# Patient Record
Sex: Female | Born: 1960 | Race: Black or African American | Hispanic: No | Marital: Single | Smoking: Never smoker
Health system: Southern US, Community
[De-identification: ages and names within clinical notes are randomized; demographics above are authoritative.]

## PROBLEM LIST (undated history)

## (undated) DIAGNOSIS — D869 Sarcoidosis, unspecified: Secondary | ICD-10-CM

## (undated) DIAGNOSIS — I341 Nonrheumatic mitral (valve) prolapse: Secondary | ICD-10-CM

## (undated) DIAGNOSIS — I4891 Unspecified atrial fibrillation: Secondary | ICD-10-CM

## (undated) HISTORY — PX: CHOLECYSTECTOMY: SHX55

## (undated) HISTORY — PX: COLPOSCOPY VULVA W/ BIOPSY: SUR282

## (undated) HISTORY — PX: BREAST EXCISIONAL BIOPSY: SUR124

## (undated) HISTORY — DX: Unspecified atrial fibrillation: I48.91

---

## 2008-11-15 ENCOUNTER — Encounter: Admission: RE | Admit: 2008-11-15 | Discharge: 2008-11-15 | Payer: Self-pay | Admitting: Obstetrics and Gynecology

## 2008-11-22 ENCOUNTER — Encounter: Admission: RE | Admit: 2008-11-22 | Discharge: 2008-11-22 | Payer: Self-pay | Admitting: Obstetrics and Gynecology

## 2009-11-16 ENCOUNTER — Encounter: Admission: RE | Admit: 2009-11-16 | Discharge: 2009-11-16 | Payer: Self-pay | Admitting: Obstetrics and Gynecology

## 2010-10-30 ENCOUNTER — Other Ambulatory Visit: Payer: Self-pay | Admitting: Obstetrics and Gynecology

## 2010-10-30 DIAGNOSIS — Z1231 Encounter for screening mammogram for malignant neoplasm of breast: Secondary | ICD-10-CM

## 2010-11-20 ENCOUNTER — Ambulatory Visit
Admission: RE | Admit: 2010-11-20 | Discharge: 2010-11-20 | Disposition: A | Discharge status: Home / Self Care | Payer: BC Managed Care – PPO | Source: Ambulatory Visit | Attending: Obstetrics and Gynecology | Admitting: Obstetrics and Gynecology

## 2010-11-20 DIAGNOSIS — Z1231 Encounter for screening mammogram for malignant neoplasm of breast: Secondary | ICD-10-CM

## 2010-11-22 ENCOUNTER — Other Ambulatory Visit: Payer: Self-pay | Admitting: Obstetrics and Gynecology

## 2010-11-22 DIAGNOSIS — R928 Other abnormal and inconclusive findings on diagnostic imaging of breast: Secondary | ICD-10-CM

## 2010-12-11 ENCOUNTER — Ambulatory Visit
Admission: RE | Admit: 2010-12-11 | Discharge: 2010-12-11 | Disposition: A | Discharge status: Home / Self Care | Payer: BC Managed Care – PPO | Source: Ambulatory Visit | Attending: Obstetrics and Gynecology | Admitting: Obstetrics and Gynecology

## 2010-12-11 ENCOUNTER — Ambulatory Visit: Payer: BC Managed Care – PPO

## 2010-12-11 DIAGNOSIS — R928 Other abnormal and inconclusive findings on diagnostic imaging of breast: Secondary | ICD-10-CM

## 2011-06-08 ENCOUNTER — Emergency Department (HOSPITAL_COMMUNITY)
Admission: EM | Admit: 2011-06-08 | Discharge: 2011-06-09 | Disposition: A | Discharge status: Home / Self Care | Payer: BC Managed Care – PPO | Attending: Emergency Medicine | Admitting: Emergency Medicine

## 2011-06-08 ENCOUNTER — Encounter (HOSPITAL_COMMUNITY): Payer: Self-pay | Admitting: Family Medicine

## 2011-06-08 DIAGNOSIS — M542 Cervicalgia: Secondary | ICD-10-CM | POA: Insufficient documentation

## 2011-06-08 DIAGNOSIS — R079 Chest pain, unspecified: Secondary | ICD-10-CM | POA: Insufficient documentation

## 2011-06-08 DIAGNOSIS — M25519 Pain in unspecified shoulder: Secondary | ICD-10-CM | POA: Insufficient documentation

## 2011-06-08 DIAGNOSIS — T148XXA Other injury of unspecified body region, initial encounter: Secondary | ICD-10-CM | POA: Insufficient documentation

## 2011-06-08 HISTORY — DX: Sarcoidosis, unspecified: D86.9
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
## 2011-06-09 MED ORDER — CYCLOBENZAPRINE HCL 10 MG PO TABS: 10.0000 mg | ORAL_TABLET | Freq: Two times a day (BID) | ORAL | Status: AC | PRN

## 2011-06-09 MED ORDER — IBUPROFEN 800 MG PO TABS
800.0000 mg | ORAL_TABLET | Freq: Once | ORAL | Status: AC
  Administered 2011-06-09: 800 mg via ORAL
  Filled 2011-06-09: qty 1

## 2011-06-09 MED ORDER — CYCLOBENZAPRINE HCL 10 MG PO TABS
10.0000 mg | ORAL_TABLET | Freq: Once | ORAL | Status: AC
  Administered 2011-06-09: 10 mg via ORAL
  Filled 2011-06-09: qty 1

## 2011-06-09 MED ORDER — IBUPROFEN 800 MG PO TABS: 800.0000 mg | ORAL_TABLET | Freq: Three times a day (TID) | ORAL | Status: AC
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
## 2011-06-09 NOTE — ED Provider Notes (Signed)
Medical screening examination/treatment/procedure(s) were performed by non-physician practitioner and as supervising physician I was immediately available for consultation/collaboration.  Sunnie Nielsen, MD 06/09/11 [phone]

## 2011-11-05 ENCOUNTER — Other Ambulatory Visit: Payer: Self-pay | Admitting: Physician Assistant

## 2011-11-05 ENCOUNTER — Ambulatory Visit
Admission: RE | Admit: 2011-11-05 | Discharge: 2011-11-05 | Disposition: A | Discharge status: Home / Self Care | Payer: BC Managed Care – PPO | Source: Ambulatory Visit | Attending: Physician Assistant | Admitting: Physician Assistant

## 2011-11-05 DIAGNOSIS — R52 Pain, unspecified: Secondary | ICD-10-CM

## 2011-12-02 ENCOUNTER — Other Ambulatory Visit: Payer: Self-pay | Admitting: Obstetrics and Gynecology

## 2011-12-02 DIAGNOSIS — Z1231 Encounter for screening mammogram for malignant neoplasm of breast: Secondary | ICD-10-CM

## 2012-01-07 ENCOUNTER — Ambulatory Visit
Admission: RE | Admit: 2012-01-07 | Discharge: 2012-01-07 | Disposition: A | Discharge status: Home / Self Care | Payer: BC Managed Care – PPO | Source: Ambulatory Visit | Attending: Obstetrics and Gynecology | Admitting: Obstetrics and Gynecology

## 2012-01-07 DIAGNOSIS — Z1231 Encounter for screening mammogram for malignant neoplasm of breast: Secondary | ICD-10-CM

## 2012-11-10 ENCOUNTER — Other Ambulatory Visit: Payer: Self-pay

## 2012-11-10 DIAGNOSIS — Z1231 Encounter for screening mammogram for malignant neoplasm of breast: Secondary | ICD-10-CM

## 2013-01-07 ENCOUNTER — Ambulatory Visit
Admission: RE | Admit: 2013-01-07 | Discharge: 2013-01-07 | Disposition: A | Discharge status: Home / Self Care | Payer: BC Managed Care – PPO | Source: Ambulatory Visit

## 2013-01-07 DIAGNOSIS — Z1231 Encounter for screening mammogram for malignant neoplasm of breast: Secondary | ICD-10-CM

## 2013-12-21 ENCOUNTER — Other Ambulatory Visit: Payer: Self-pay

## 2013-12-21 DIAGNOSIS — Z1231 Encounter for screening mammogram for malignant neoplasm of breast: Secondary | ICD-10-CM

## 2014-01-10 ENCOUNTER — Ambulatory Visit
Admission: RE | Admit: 2014-01-10 | Discharge: 2014-01-10 | Disposition: A | Discharge status: Home / Self Care | Payer: BC Managed Care – PPO | Source: Ambulatory Visit

## 2014-01-10 DIAGNOSIS — Z1231 Encounter for screening mammogram for malignant neoplasm of breast: Secondary | ICD-10-CM

## 2014-12-04 ENCOUNTER — Emergency Department (HOSPITAL_COMMUNITY)
Admission: EM | Admit: 2014-12-04 | Discharge: 2014-12-04 | Disposition: A | Discharge status: Home / Self Care | Payer: BLUE CROSS/BLUE SHIELD | Attending: Emergency Medicine | Admitting: Emergency Medicine

## 2014-12-04 ENCOUNTER — Emergency Department (HOSPITAL_COMMUNITY): Payer: BLUE CROSS/BLUE SHIELD

## 2014-12-04 ENCOUNTER — Encounter (HOSPITAL_COMMUNITY): Payer: Self-pay

## 2014-12-04 DIAGNOSIS — R911 Solitary pulmonary nodule: Secondary | ICD-10-CM | POA: Diagnosis not present

## 2014-12-04 DIAGNOSIS — Z79899 Other long term (current) drug therapy: Secondary | ICD-10-CM | POA: Diagnosis not present

## 2014-12-04 DIAGNOSIS — R05 Cough: Secondary | ICD-10-CM

## 2014-12-04 DIAGNOSIS — D649 Anemia, unspecified: Secondary | ICD-10-CM | POA: Insufficient documentation

## 2014-12-04 DIAGNOSIS — D869 Sarcoidosis, unspecified: Secondary | ICD-10-CM | POA: Diagnosis not present

## 2014-12-04 DIAGNOSIS — R059 Cough, unspecified: Secondary | ICD-10-CM

## 2014-12-04 DIAGNOSIS — Z8679 Personal history of other diseases of the circulatory system: Secondary | ICD-10-CM | POA: Diagnosis not present

## 2014-12-04 HISTORY — DX: Nonrheumatic mitral (valve) prolapse: I34.1

## 2014-12-04 LAB — BASIC METABOLIC PANEL
Anion gap: 6 (ref 5–15)
BUN: 12 mg/dL (ref 6–20)
CO2: 28 mmol/L (ref 22–32)
Calcium: 9 mg/dL (ref 8.9–10.3)
Chloride: 107 mmol/L (ref 101–111)
Creatinine, Ser: 0.88 mg/dL (ref 0.44–1.00)
GFR calc Af Amer: >60 mL/min (ref >60)
GFR calc non Af Amer: >60 mL/min (ref >60)
Glucose, Bld: 95 mg/dL (ref 65–99)
Potassium: 3.6 mmol/L (ref 3.5–5.1)
Sodium: 141 mmol/L (ref 135–145)

## 2014-12-04 LAB — CBC
HCT: 34.1 % — ABNORMAL LOW (ref 36.0–46.0)
Hemoglobin: 11.3 g/dL — ABNORMAL LOW (ref 12.0–15.0)
MCH: 28.7 pg (ref 26.0–34.0)
MCHC: 33.1 g/dL (ref 30.0–36.0)
MCV: 86.5 fL (ref 78.0–100.0)
Platelets: 251 10*3/uL (ref 150–400)
RBC: 3.94 MIL/uL (ref 3.87–5.11)
RDW: 13.6 % (ref 11.5–15.5)
WBC: 6.1 10*3/uL (ref 4.0–10.5)

## 2014-12-04 LAB — I-STAT TROPONIN, ED: Troponin i, poc: 0 ng/mL (ref 0.00–0.08)

## 2014-12-04 MED ORDER — AZITHROMYCIN 250 MG PO TABS: 250.0000 mg | ORAL_TABLET | Freq: Every day | ORAL | Status: DC

## 2014-12-04 MED ORDER — PREDNISONE 10 MG PO TABS: 20.0000 mg | ORAL_TABLET | Freq: Every day | ORAL | Status: DC

## 2014-12-04 MED ORDER — PREDNISONE 20 MG PO TABS
60.0000 mg | ORAL_TABLET | Freq: Once | ORAL | Status: AC
Start: 2014-12-04 — End: 2014-12-04
  Filled 2014-12-04: qty 3

## 2014-12-04 MED ORDER — PREDNISONE 20 MG PO TABS
60.0000 mg | ORAL_TABLET | Freq: Once | ORAL | Status: AC
Start: 2014-12-04 — End: 2014-12-04
  Administered 2014-12-04: 60 mg via ORAL
  Filled 2014-12-04: qty 3

## 2014-12-04 MED ORDER — IOHEXOL 300 MG/ML  SOLN
75.0000 mL | Freq: Once | INTRAMUSCULAR | Status: AC | PRN
  Administered 2014-12-04: 75 mL via INTRAVENOUS

## 2014-12-04 NOTE — ED Provider Notes (Signed)
CSN: [id number]     Arrival date & time 12/04/14  1437 History   First MD Initiated Contact with Patient 12/04/14 1537     Chief Complaint  Patient presents with  . Cough  . Chest Pain     (Consider location/radiation/quality/duration/timing/severity/associated sxs/prior Treatment) HPI Patient with cough for one month she states that symptoms initially began with some URI type symptoms. She had objective fever and rhinorrhea. He thought that she continued to have cough. She does have a history of sarcoidosis but has not been treated for it for many years. She does not smoke. She has gotten more dyspneic today.  She has anterior chest pain which is sharp and worsens with cough.  She denies h.o. Of dvt or pe.     Past Medical History  Diagnosis Date  . Sarcoidosis (HCC)   . MVP (mitral valve prolapse)    Past Surgical History  Procedure Laterality Date  . Cesarean section    . Cholecystectomy    . Colposcopy vulva w/ biopsy     Family History  Problem Relation Age of Onset  . Stroke Mother   . Dementia Mother   . Heart failure Father   . Stroke Father   . Heart failure Sister    Social History  Substance Use Topics  . Smoking status: Never Smoker   . Smokeless tobacco: Never Used  . Alcohol Use: No   OB History    No data available     Review of Systems  All other systems reviewed and are negative.     Allergies  Review of patient's allergies indicates no known allergies.  Home Medications   Prior to Admission medications   Medication Sig Start Date End Date Taking? Authorizing Provider  BIOTIN PO Take 1 tablet by mouth daily.   Yes Historical Provider, MD  guaiFENesin (MUCINEX) 600 MG 12 hr tablet Take 1,200 mg by mouth 2 (two) times daily as needed for cough or to loosen phlegm.   Yes Historical Provider, MD  Multiple Vitamin (MULITIVITAMIN WITH MINERALS) TABS Take 1 tablet by mouth daily.   Yes Historical Provider, MD   BP 125/64 mmHg  Pulse 65   Temp(Src) 98.3 F (36.8 C) (Oral)  Resp 21  SpO2 100%  LMP 11/24/2014 Physical Exam  Constitutional: She is oriented to person, place, and time. She appears well-developed and well-nourished.  HENT:  Head: Normocephalic and atraumatic.  Right Ear: External ear normal.  Left Ear: External ear normal.  Nose: Nose normal.  Mouth/Throat: Oropharynx is clear and moist.  Eyes: Conjunctivae and EOM are normal. Pupils are equal, round, and reactive to light.  Neck: Normal range of motion. Neck supple. No JVD present. No tracheal deviation present. No thyromegaly present.  Cardiovascular: Normal rate, regular rhythm, normal heart sounds and intact distal pulses.   Pulmonary/Chest: Effort normal and breath sounds normal. She has no wheezes.  Abdominal: Soft. Bowel sounds are normal. She exhibits no mass. There is no tenderness. There is no guarding.  Musculoskeletal: Normal range of motion.  Lymphadenopathy:    She has no cervical adenopathy.  Neurological: She is alert and oriented to person, place, and time. She has normal reflexes. No cranial nerve deficit or sensory deficit. Gait normal. GCS eye subscore is 4. GCS verbal subscore is 5. GCS motor subscore is 6.  Reflex Scores:      Bicep reflexes are 2+ on the right side and 2+ on the left side.      Patellar  reflexes are 2+ on the right side and 2+ on the left side. Strength is normal and equal throughout. Cranial nerves grossly intact. Patient fluent. No gross ataxia and patient able to ambulate without difficulty.  Skin: Skin is warm and dry.  Psychiatric: She has a normal mood and affect. Her behavior is normal. Judgment and thought content normal.  Nursing note and vitals reviewed.   ED Course  Procedures (including critical care time) Labs Review Labs Reviewed  CBC - Abnormal; Notable for the following:    Hemoglobin 11.3 (*)    HCT 34.1 (*)    All other components within normal limits  BASIC METABOLIC PANEL  I-STAT  TROPOININ, ED    Imaging Review Dg Chest 2 View  12/04/2014  CLINICAL DATA:  Cough for more than 1 month, with shortness of breath and left chest pain. Current history of sarcoidosis. Initial encounter. EXAM: CHEST  2 VIEW COMPARISON:  None. FINDINGS: The lungs are well-aerated. There is a 2.4 cm nodule near the left lung base. Though this could reflect pulmonary sarcoidosis, malignancy cannot be excluded. There is no evidence of pleural effusion or pneumothorax. The cardiomediastinal silhouette is within normal limits. No acute osseous abnormalities are seen. IMPRESSION: 2.4 cm nodule noted near the left lung base. Though this could reflect pulmonary sarcoidosis given the patient's history, malignancy cannot be excluded. CT of the chest is recommended for further evaluation. Electronically Signed   By: Roanna Raider M.D.   On: 12/04/2014 16:09   Ct Chest W Contrast  12/04/2014  CLINICAL DATA:  Follow-up nodule in chest radiograph. History of sarcoidosis. EXAM: CT CHEST WITH CONTRAST TECHNIQUE: Multidetector CT imaging of the chest was performed during intravenous contrast administration. CONTRAST:  75mL OMNIPAQUE IOHEXOL 300 MG/ML  SOLN COMPARISON:  Chest radiograph December 04, 2014 at 1555 hours FINDINGS: MEDIASTINUM: Heart and pericardium are unremarkable. Thoracic aorta is normal course and caliber, unremarkable. No lymphadenopathy by CT size criteria. LUNGS: Tracheobronchial tree is patent, no pneumothorax. No pleural effusions, focal consolidations. Smoothly marginated 21 x 17 mm enhancing lingular nodule abutting the pericardium. SOFT TISSUES AND OSSEOUS STRUCTURES: Included view of the abdomen is not acute; status post cholecystectomy. Visualized soft tissues and included osseous structures appear normal. IMPRESSION: Solitary 21 x 17 mm lingular pulmonary nodule. Though this could represent sarcoidosis, recommend follow-up CT at around 3, 9 and 24 months, dynamic contrast-enhanced CT, PET and/or  biopsy. No acute cardiopulmonary process. Electronically Signed   By: Awilda Metro M.D.   On: 12/04/2014 18:34   I have personally reviewed and evaluated these images and lab results as part of my medical decision-making.   EKG Interpretation   Date/Time:  Sunday December 04 2014 14:55:16 EDT Ventricular Rate:  63 PR Interval:  189 QRS Duration: 76 QT Interval:  430 QTC Calculation: 440 R Axis:   70 Text Interpretation:  Sinus rhythm Borderline T abnormalities, anterior  leads Confirmed by Janna Oak MD, Duwayne Heck ([id number]) on 12/04/2014 3:49:08 PM      MDM   Final diagnoses:  Cough  Lung nodule  Sarcoid (HCC)  Anemia, unspecified anemia type   54 year old female history of sarcoidosis presents today with 1 month history of cough. Here plain x-Revis Whalin revealed a left lower lobe nodule and subsequently a CT was done. CT results reveal a 21 x 17 mm enhancing lingular nodule. I discussed this with the patient. It most likely represents sarcoidosis. However, she will be treated with antibiotics and will be placed on prednisone. She does have  a primary care doctor. I discussed with her the necessity of close follow-up before the prednisone burst is completed so a decision can be made regarding further treatment of sarcoid, assessment for infection, and need for follow-up CT scans. Patient has been hemodynamically stable here. She is mildly anemic with a hemoglobin of 11.3. She denies any bleeding. She will also have this followed up with her primary care physician.    Margarita Grizzleanielle Jackquelyn Sundberg, MD 12/04/14 Mikle [id number]

## 2014-12-04 NOTE — ED Notes (Signed)
Patient states she has had a cough >1 month and began having SOB andintermittent left chest pain since last night  that radiates into the left rib cage area. Patiaent has a history of MVP.

## 2014-12-04 NOTE — Discharge Instructions (Signed)
You have a lung nodule seen on your CT scan. You are being treated with antibiotics and steroids given your history of sarcoidosis. Please return if you're worse at any time. Please recheck with your doctor this week. Your hemoglobin is slightly low at 11.3 and should also be rechecked.

## 2014-12-04 NOTE — ED Notes (Signed)
She remains in no distress.  I have informed her and her husband about plan to CT and results thus far, for which they thank me.

## 2014-12-05 ENCOUNTER — Telehealth (HOSPITAL_COMMUNITY): Payer: Self-pay

## 2014-12-12 ENCOUNTER — Ambulatory Visit (INDEPENDENT_AMBULATORY_CARE_PROVIDER_SITE_OTHER): Payer: BLUE CROSS/BLUE SHIELD | Admitting: Internal Medicine

## 2014-12-12 ENCOUNTER — Encounter: Payer: Self-pay | Admitting: Internal Medicine

## 2014-12-12 ENCOUNTER — Other Ambulatory Visit: Payer: Self-pay

## 2014-12-12 VITALS — BP 110/72 | HR 56 | Ht 62.0 in | Wt 163.4 lb

## 2014-12-12 DIAGNOSIS — D860 Sarcoidosis of lung: Secondary | ICD-10-CM

## 2014-12-12 DIAGNOSIS — Z1231 Encounter for screening mammogram for malignant neoplasm of breast: Secondary | ICD-10-CM

## 2014-12-12 DIAGNOSIS — R05 Cough: Secondary | ICD-10-CM

## 2014-12-12 DIAGNOSIS — R058 Other specified cough: Secondary | ICD-10-CM

## 2014-12-12 MED ORDER — FAMOTIDINE 20 MG PO TABS: ORAL_TABLET | ORAL | Status: DC

## 2014-12-12 MED ORDER — PANTOPRAZOLE SODIUM 40 MG PO TBEC: 40.0000 mg | DELAYED_RELEASE_TABLET | Freq: Every day | ORAL | Status: DC

## 2014-12-12 MED ORDER — PREDNISONE 10 MG PO TABS: ORAL_TABLET | ORAL | Status: DC

## 2014-12-12 NOTE — Progress Notes (Signed)
Subjective:    Patient ID: Christina Mclaughlin, female    DOB: 1960/10/08, MRN: [id number]  HPI  [id number] yobf never smoker or passive smoke with h/o sarcoid 1999 [facility] with joint pain, ocular symptoms and "abn cxr"  rx with pred x < 1 years  Referred 12/12/2014  by Dr Nicholos Johnseade for cough ? Sarcoid   12/12/2014 1st [facility] office visit/ Ares Cardozo   Chief Complaint  Patient presents with  . Pulmonary Consult    Referred by Dr. Elias Elseobert Reade. Pt states dxed with Sarcoid in 1998 by lung bx. She c/o increased cough x 4 months- non prod and sometimes wakes her up at night.   cough was insidious p uri early summer 2016 , persistent daily and at least sev times noct also > L chest discomfort  p severe spell of coughing > [facility] ER 12/04/14 ? Sarcoid > pred 60 > 20 mg per day taken at lunch time and no improvement in cough by Dec 08 2014 and started tessilon which helped a lot still taking up ot 200 mg bid. No ocular or joint symptoms now and did not have cough or sob ever at onset of sarcoid in 1999.  No baseline cxr's or records avail at ov   No obvious day to day or daytime variability in terms of the cough  or assoc sob or cp or chest tightness, subjective wheeze or overt sinus or hb symptoms. No unusual exp hx or h/o childhood pna/ asthma or knowledge of premature birth.  Sleeping ok without nocturnal  or early am exacerbation  of respiratory  c/o's or need for noct saba. Also denies any obvious fluctuation of symptoms with weather or environmental changes or other aggravating or alleviating factors except as outlined above   Current Medications, Allergies, Complete Past Medical History, Past Surgical History, Family History, and Social History were reviewed in [facility] electronic medical record.             Review of Systems  Constitutional: Negative for fever, chills and unexpected weight change.  HENT: Negative for congestion, dental problem, ear pain, nosebleeds, postnasal drip,  rhinorrhea, sinus pressure, sneezing, sore throat, trouble swallowing and voice change.   Eyes: Negative for visual disturbance.  Respiratory: Positive for cough. Negative for choking and shortness of breath.   Cardiovascular: Negative for chest pain and leg swelling.  Gastrointestinal: Negative for vomiting, abdominal pain and diarrhea.  Genitourinary: Negative for difficulty urinating.  Musculoskeletal: Negative for arthralgias.  Skin: Negative for rash.  Neurological: Negative for tremors, syncope and headaches.  Hematological: Does not bruise/bleed easily.       Objective:   Physical Exam  amb healthy appearing mildly obese  Black female nad  Wt Readings from Last 3 Encounters:  12/12/14 163 lb 6.4 oz (74.118 kg)    Vital signs reviewed   HEENT: nl dentition, turbinates, and oropharynx. Nl external ear canals without cough reflex   NECK :  without JVD/Nodes/TM/ nl carotid upstrokes bilaterally   LUNGS: no acc muscle use, clear to A and P bilaterally without cough on insp or exp maneuvers   CV:  RRR  no s3 or murmur or increase in P2, no edema   ABD:  soft and nontender with nl excursion in the supine position. No bruits or organomegaly, bowel sounds nl  MS:  warm without deformities, calf tenderness, cyanosis or clubbing  SKIN: warm and dry without lesions    NEURO:  alert, approp, no deficits  I personally reviewed images and agree with radiology impression as follows:  CXR:  12/04/14 with no baseline avail  2.4 cm nodule noted near the left lung base/ no sign adenopathy confirmed on CT 12/04/14     Assessment & Plan:

## 2014-12-12 NOTE — Assessment & Plan Note (Addendum)
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    Discussed in detail all the  indications, usual  risks and alternatives  relative to the benefits with patient who agrees to proceed with conservative f/u as outlined     In addition, her cough is no better on high doses of pred so rec she taper off as this is not likely at all related to sarcoid

## 2014-12-12 NOTE — Patient Instructions (Addendum)
Pantoprazole (protonix) 40 mg   Take  30-60 min before first meal of the day and Pepcid (famotidine)  20 mg one @  bedtime until return to office - this is the best way to tell whether stomach acid is contributing to your problem.   GERD (REFLUX)  is an extremely common cause of respiratory symptoms just like yours , many times with no obvious heartburn at all.    It can be treated with medication, but also with lifestyle changes including elevation of the head of your bed (ideally with 6 inch  bed blocks),  Smoking cessation, avoidance of late meals, excessive alcohol, and avoid fatty foods, chocolate, peppermint, colas, red wine, and acidic juices such as orange juice.  NO MINT OR MENTHOL PRODUCTS SO NO COUGH DROPS  USE SUGARLESS CANDY INSTEAD (Jolley ranchers or Stover's or Life Savers) or even ice chips will also do - the key is to swallow to prevent all throat clearing. NO OIL BASED VITAMINS - use powdered substitutes.  Try to obtain [facility] pulmonary [facility] and your last cxr report from [place] and bring them with you to next office visit   Prednisone 10 mg per day x one week then 5 mg daily x 2 weeks then return in 4 weeks with cxr

## 2014-12-13 DIAGNOSIS — R05 Cough: Secondary | ICD-10-CM | POA: Insufficient documentation

## 2014-12-13 DIAGNOSIS — R058 Other specified cough: Secondary | ICD-10-CM | POA: Insufficient documentation

## 2014-12-13 NOTE — Assessment & Plan Note (Signed)
The most common causes of chronic cough in immunocompetent adults include the following: upper airway cough syndrome (UACS), previously referred to as postnasal drip syndrome (PNDS), which is caused by variety of rhinosinus conditions; (2) asthma; (3) GERD; (4) chronic bronchitis from cigarette smoking or other inhaled environmental irritants; (5) nonasthmatic eosinophilic bronchitis; and (6) bronchiectasis.   These conditions, singly or in combination, have accounted for up to 94% of the causes of chronic cough in prospective studies.   Other conditions have constituted no >6% of the causes in prospective studies These have included bronchogenic carcinoma, chronic interstitial pneumonia, sarcoidosis, left ventricular failure, ACEI-induced cough, and aspiration from a condition associated with pharyngeal dysfunction.    Chronic cough is often simultaneously caused by more than one condition. A single cause has been found from 38 to 82% of the time, multiple causes from 18 to 62%. Multiply caused cough has been the result of three diseases up to 42% of the time.       Based on hx and exam, this is most likely:  Classic Upper airway cough syndrome, so named because it's frequently impossible to sort out how much is  CR/sinusitis with freq throat clearing (which can be related to primary GERD)   vs  causing  secondary (" extra esophageal")  GERD from wide swings in gastric pressure that occur with throat clearing, often  promoting self use of mint and menthol lozenges that reduce the lower esophageal sphincter tone and exacerbate the problem further in a cyclical fashion.   These are the same pts (now being labeled as having "irritable larynx syndrome" by some cough centers) who not infrequently have a history of having failed to tolerate ace inhibitors,  dry powder inhalers or biphosphonates or report having atypical reflux symptoms that don't respond to standard doses of PPI , and are easily confused as  having aecopd or asthma flares by even experienced allergists/ pulmonologists.   The first step is to maximize acid suppression and eliminate cyclical coughing then regroup if the cough persists.  I had an extended discussion with the patient reviewing all relevant studies completed to date and  lasting 35 min  1) Explained: The standardized cough guidelines published in Chest by Richard Irwin in 2006 are still the best available and consist of a multiple step process (up to 12!) , not a single office visit,  and are intended  to address this problem logically,  with an alogrithm dependent on response to empiric treatment at  each progressive step  to determine a specific diagnosis with  minimal addtional testing needed. Therefore if adherence is an issue or can't be accurately verified,  it's very unlikely the standard evaluation and treatment will be successful here.    Furthermore, response to therapy (other than acute cough suppression, which should only be used short term with avoidance of narcotic containing cough syrups if possible), can be a gradual process for which the patient may perceive immediate benefit.  Unlike going to an eye doctor where the best perscription is almost always the first one and is immediately effective, this is almost never the case in the management of chronic cough syndromes. Therefore the patient needs to commit up front to consistently adhere to recommendations  for up to 6 weeks of therapy directed at the likely underlying problem(s) before the response can be reasonably evaluated.     2) Each maintenance medication was reviewed in detail including most importantly the difference between maintenance and prns and under what   circumstances the prns are to be triggered using an action plan format that is not reflected in the computer generated alphabetically organized AVS.    Please see instructions for details which were reviewed in writing and the patient given a  copy highlighting the part that I personally wrote and discussed at today's ov.   See instructions for specific recommendations which were reviewed directly with the patient who was given a copy with highlighter outlining the key components.   

## 2015-01-09 ENCOUNTER — Encounter: Payer: Self-pay | Admitting: Internal Medicine

## 2015-01-09 ENCOUNTER — Ambulatory Visit (INDEPENDENT_AMBULATORY_CARE_PROVIDER_SITE_OTHER): Payer: BLUE CROSS/BLUE SHIELD | Admitting: Internal Medicine

## 2015-01-09 ENCOUNTER — Ambulatory Visit (INDEPENDENT_AMBULATORY_CARE_PROVIDER_SITE_OTHER)
Admission: RE | Admit: 2015-01-09 | Discharge: 2015-01-09 | Disposition: A | Discharge status: Home / Self Care | Payer: BLUE CROSS/BLUE SHIELD | Source: Ambulatory Visit | Attending: Internal Medicine | Admitting: Internal Medicine

## 2015-01-09 ENCOUNTER — Other Ambulatory Visit (INDEPENDENT_AMBULATORY_CARE_PROVIDER_SITE_OTHER): Payer: BLUE CROSS/BLUE SHIELD

## 2015-01-09 VITALS — BP 122/78 | HR 57 | Ht 62.0 in | Wt 167.0 lb

## 2015-01-09 DIAGNOSIS — D860 Sarcoidosis of lung: Secondary | ICD-10-CM

## 2015-01-09 DIAGNOSIS — R05 Cough: Secondary | ICD-10-CM

## 2015-01-09 DIAGNOSIS — R058 Other specified cough: Secondary | ICD-10-CM

## 2015-01-09 LAB — CBC WITH DIFFERENTIAL/PLATELET
Basophils Absolute: 0 10*3/uL (ref 0.0–0.1)
Basophils Relative: 0.6 % (ref 0.0–3.0)
Eosinophils Absolute: 0.2 10*3/uL (ref 0.0–0.7)
Eosinophils Relative: 4.3 % (ref 0.0–5.0)
HCT: 35.2 % — ABNORMAL LOW (ref 36.0–46.0)
Hemoglobin: 11.7 g/dL — ABNORMAL LOW (ref 12.0–15.0)
Lymphocytes Relative: 41.1 % (ref 12.0–46.0)
Lymphs Abs: 1.5 10*3/uL (ref 0.7–4.0)
MCHC: 33.2 g/dL (ref 30.0–36.0)
MCV: 85.4 fl (ref 78.0–100.0)
Monocytes Absolute: 0.4 10*3/uL (ref 0.1–1.0)
Monocytes Relative: 10.7 % (ref 3.0–12.0)
Neutro Abs: 1.6 10*3/uL (ref 1.4–7.7)
Neutrophils Relative %: 43.3 % (ref 43.0–77.0)
Platelets: 241 10*3/uL (ref 150.0–400.0)
RBC: 4.12 Mil/uL (ref 3.87–5.11)
RDW: 14.3 % (ref 11.5–15.5)
WBC: 3.7 10*3/uL — ABNORMAL LOW (ref 4.0–10.5)

## 2015-01-09 LAB — SEDIMENTATION RATE: Sed Rate: 36 mm/hr — ABNORMAL HIGH (ref 0–22)

## 2015-01-09 NOTE — Progress Notes (Signed)
Quick Note:  Patient returned call. Advised of lab results / recs as stated by MW. Pt verbalized understanding and denied any questions. ______ 

## 2015-01-09 NOTE — Progress Notes (Signed)
Subjective:    Patient ID: Christina Mclaughlin, female    DOB: Jul 06, 1960    MRN: [id number]   Brief patient profile:  86 yobf never smoker or passive smoke with h/o sarcoid 1999 dx at [place] with joint pain, ocular symptoms and "abn cxr"  rx with pred x < 1 years  Referred 12/12/2014  by Dr Alyson Ingles for cough ? Sarcoid    History of Present Illness  12/12/2014 1st [facility] office visit/ Christina Mclaughlin   Chief Complaint  Patient presents with  . Pulmonary Consult    Referred by Dr. Maury Dus. Pt states dxed with Sarcoid in 1998 by lung bx. She c/o increased cough x 4 months- non prod and sometimes wakes her up at night.   cough was insidious p uri early summer 2016 , persistent daily and at least sev times noct also > L chest discomfort  p severe spell of coughing > [facility] ER 12/04/14 ? Sarcoid > pred 60 > 20 mg per day taken at lunch time and no improvement in cough by Dec 08 2014 and started tessilon which helped a lot still taking up ot 200 mg bid. No ocular or joint symptoms now and did not have cough or sob ever at onset of sarcoid in 1999.  No baseline cxr's or records avail at ov  rec Pantoprazole (protonix) 40 mg   Take  30-60 min before first meal of the day and Pepcid (famotidine)  20 mg one @  bedtime until return to office - this is the best way to tell whether stomach acid is contributing to your problem.  GERD diet  Try to obtain your last pulmonary note from [facility] and your last cxr report from [place] and bring them with you to next office visit  Prednisone 10 mg per day x one week then 5 mg daily x 2 weeks then return in 4 weeks with cxr     01/09/2015  f/u ov/Birney Belshe re: cough since July 2016 better off pred and on acid suppression  Chief Complaint  Patient presents with  . Follow-up    Pt c/o mild cough and some nasal congestion. Pt denies wheeze/SOB/CP/tightness. Pt states that she can tell her symptoms flare when off of steroids.   one week prior to OV  p commercial jet onset  nasal congestion/ min cough no nasty mucus/ no sob    No obvious day to day or daytime variability or assoc chronic cough or cp or chest tightness, subjective wheeze or overt sinus or hb symptoms. No unusual exp hx or h/o childhood pna/ asthma or knowledge of premature birth.  Sleeping ok without nocturnal  or early am exacerbation  of respiratory  c/o's or need for noct saba. Also denies any obvious fluctuation of symptoms with weather or environmental changes or other aggravating or alleviating factors except as outlined above   Current Medications, Allergies, Complete Past Medical History, Past Surgical History, Family History, and Social History were reviewed in [employer] record.  ROS  The following are not active complaints unless bolded sore throat, dysphagia, dental problems, itching, sneezing,  nasal congestion or excess/ purulent secretions, ear ache,   fever, chills, sweats, unintended wt loss, classically pleuritic or exertional cp, hemoptysis,  orthopnea pnd or leg swelling, presyncope, palpitations, abdominal pain, anorexia, nausea, vomiting, diarrhea  or change in bowel or bladder habits, change in stools or urine, dysuria,hematuria,  rash, arthralgias, visual complaints, headache, numbness, weakness or ataxia or problems with walking  or coordination,  change in mood/affect or memory.                       Objective:   Physical Exam  amb healthy appearing mildly obese hoarse  Black female nad  Wt Readings from Last 3 Encounters:  01/09/15 167 lb (75.751 kg)  12/12/14 163 lb 6.4 oz (74.118 kg)    Vital signs reviewed   HEENT: nl dentition, turbinates, and oropharynx. Nl external ear canals without cough reflex   NECK :  without JVD/Nodes/TM/ nl carotid upstrokes bilaterally   LUNGS: no acc muscle use, clear to A and P bilaterally without cough on insp or exp maneuvers   CV:  RRR  no s3 or murmur or increase in P2, no edema   ABD:  soft  and nontender with nl excursion in the supine position. No bruits or organomegaly, bowel sounds nl  MS:  warm without deformities, calf tenderness, cyanosis or clubbing  SKIN: warm and dry without lesions    NEURO:  alert, approp, no deficits     Labs ordered esr/ ACE/ diff    CXR PA and Lateral:   01/09/2015 :    I personally reviewed images and agree with radiology impression as follows:    2.3 cm lingular nodule unchanged. No other nodule or adenopathy.      Assessment & Plan:

## 2015-01-09 NOTE — Assessment & Plan Note (Signed)
Christina Mclaughlin Rx rec 12/12/2014 > improved until commercial flight end of Nov 2016   Now more c/w uri vs sinus dz  Explained the natural history of uri and why it's necessary in patients at risk to treat GERD aggressively - at least  short term -   to reduce risk of evolving cyclical cough initially  triggered by epithelial injury and a heightened sensitivty to the effects of any upper airway irritants,  most importantly acid - related - then perpetuated by epithelial injury related to the cough itself as the upper airway collapses on itself.  That is, the more sensitive the epithelium becomes once it is damaged by the virus, the more the ensuing irritability> the more the cough, the more the secondary reflux (especially in those prone to reflux) the more the irritation of the sensitive mucosa and so on in a  Classic cyclical pattern.   rec advil cold and sinus, max gerd rx for now / f/u in 6 weeks with sinus study in meantime prn

## 2015-01-09 NOTE — Assessment & Plan Note (Signed)
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 and would like to look at previous pulmonary note/ last cxr before embarking on an invasive w/u  I had an extended discussion with the patient reviewing all relevant studies completed to date and  lasting 15 to 20 minutes of a 25 minute visit   Discussed in detail all the  indications, usual  risks and alternatives  relative to the benefits with patient who agrees to proceed with conservative f/u as outlined     Each maintenance medication was reviewed in detail including most importantly the difference between maintenance and prns and under what circumstances the prns are to be triggered using an action plan format that is not reflected in the computer generated alphabetically organized AVS.    Please see instructions for details which were reviewed in writing and the patient given a copy highlighting the part that I personally wrote and discussed at today's ov.

## 2015-01-09 NOTE — Patient Instructions (Addendum)
Please remember to go to the lab and  x-ray department downstairs for your tests - we will call you with the results when they are available.  We really need your last cxr report and note from your pulmonary doctor   Stay on acid suppression and diet until no need for cough medication for a week   For cold symptoms ok to use advil cold and sinus as needed  Please schedule a follow up office visit in 6 weeks, call sooner if needed

## 2015-01-10 LAB — ANGIOTENSIN CONVERTING ENZYME: Angiotensin-Converting Enzyme: 60 U/L — ABNORMAL HIGH (ref 8–52)

## 2015-01-13 ENCOUNTER — Ambulatory Visit: Payer: BLUE CROSS/BLUE SHIELD

## 2015-01-23 ENCOUNTER — Telehealth: Payer: Self-pay | Admitting: Internal Medicine

## 2015-01-23 NOTE — Telephone Encounter (Signed)
Left message for patient to call back  

## 2015-01-23 NOTE — Telephone Encounter (Signed)
I have not seen them and neither has MW, sorry

## 2015-01-23 NOTE — Telephone Encounter (Signed)
Pt returning call and can be reached @ [phone].Caren GriffinsStanley A Dalton

## 2015-01-23 NOTE — Telephone Encounter (Signed)
Do not see records scanned into patient's chart.  Verlon AuLeslie - do you know if these records have been received?  Attempted to contact patient, Left message for patient to call back.

## 2015-01-25 NOTE — Telephone Encounter (Signed)
LMTCB

## 2015-01-26 NOTE — Telephone Encounter (Signed)
lmtcb X3 for pt.  Will close message per triage protocol.  

## 2015-02-02 ENCOUNTER — Ambulatory Visit
Admission: RE | Admit: 2015-02-02 | Discharge: 2015-02-02 | Disposition: A | Discharge status: Home / Self Care | Payer: BLUE CROSS/BLUE SHIELD | Source: Ambulatory Visit

## 2015-02-02 DIAGNOSIS — Z1231 Encounter for screening mammogram for malignant neoplasm of breast: Secondary | ICD-10-CM

## 2015-02-20 ENCOUNTER — Ambulatory Visit: Payer: BLUE CROSS/BLUE SHIELD | Admitting: Internal Medicine

## 2016-01-11 ENCOUNTER — Other Ambulatory Visit: Payer: Self-pay | Admitting: Family Medicine

## 2016-01-11 DIAGNOSIS — Z1231 Encounter for screening mammogram for malignant neoplasm of breast: Secondary | ICD-10-CM

## 2016-02-14 ENCOUNTER — Ambulatory Visit
Admission: RE | Admit: 2016-02-14 | Discharge: 2016-02-14 | Disposition: A | Discharge status: Home / Self Care | Payer: BLUE CROSS/BLUE SHIELD | Source: Ambulatory Visit | Attending: Family Medicine | Admitting: Family Medicine

## 2016-02-14 DIAGNOSIS — Z1231 Encounter for screening mammogram for malignant neoplasm of breast: Secondary | ICD-10-CM

## 2017-01-24 IMAGING — CR DG CHEST 2V
2 series · 2 of 2 positions shown · non-contrast
Comparison: None.

CLINICAL DATA: Cough for more than 1 month, with shortness of
breath and left chest pain. Current history of sarcoidosis. Initial
encounter.

EXAM:
CHEST  2 VIEW

[w chest pa]
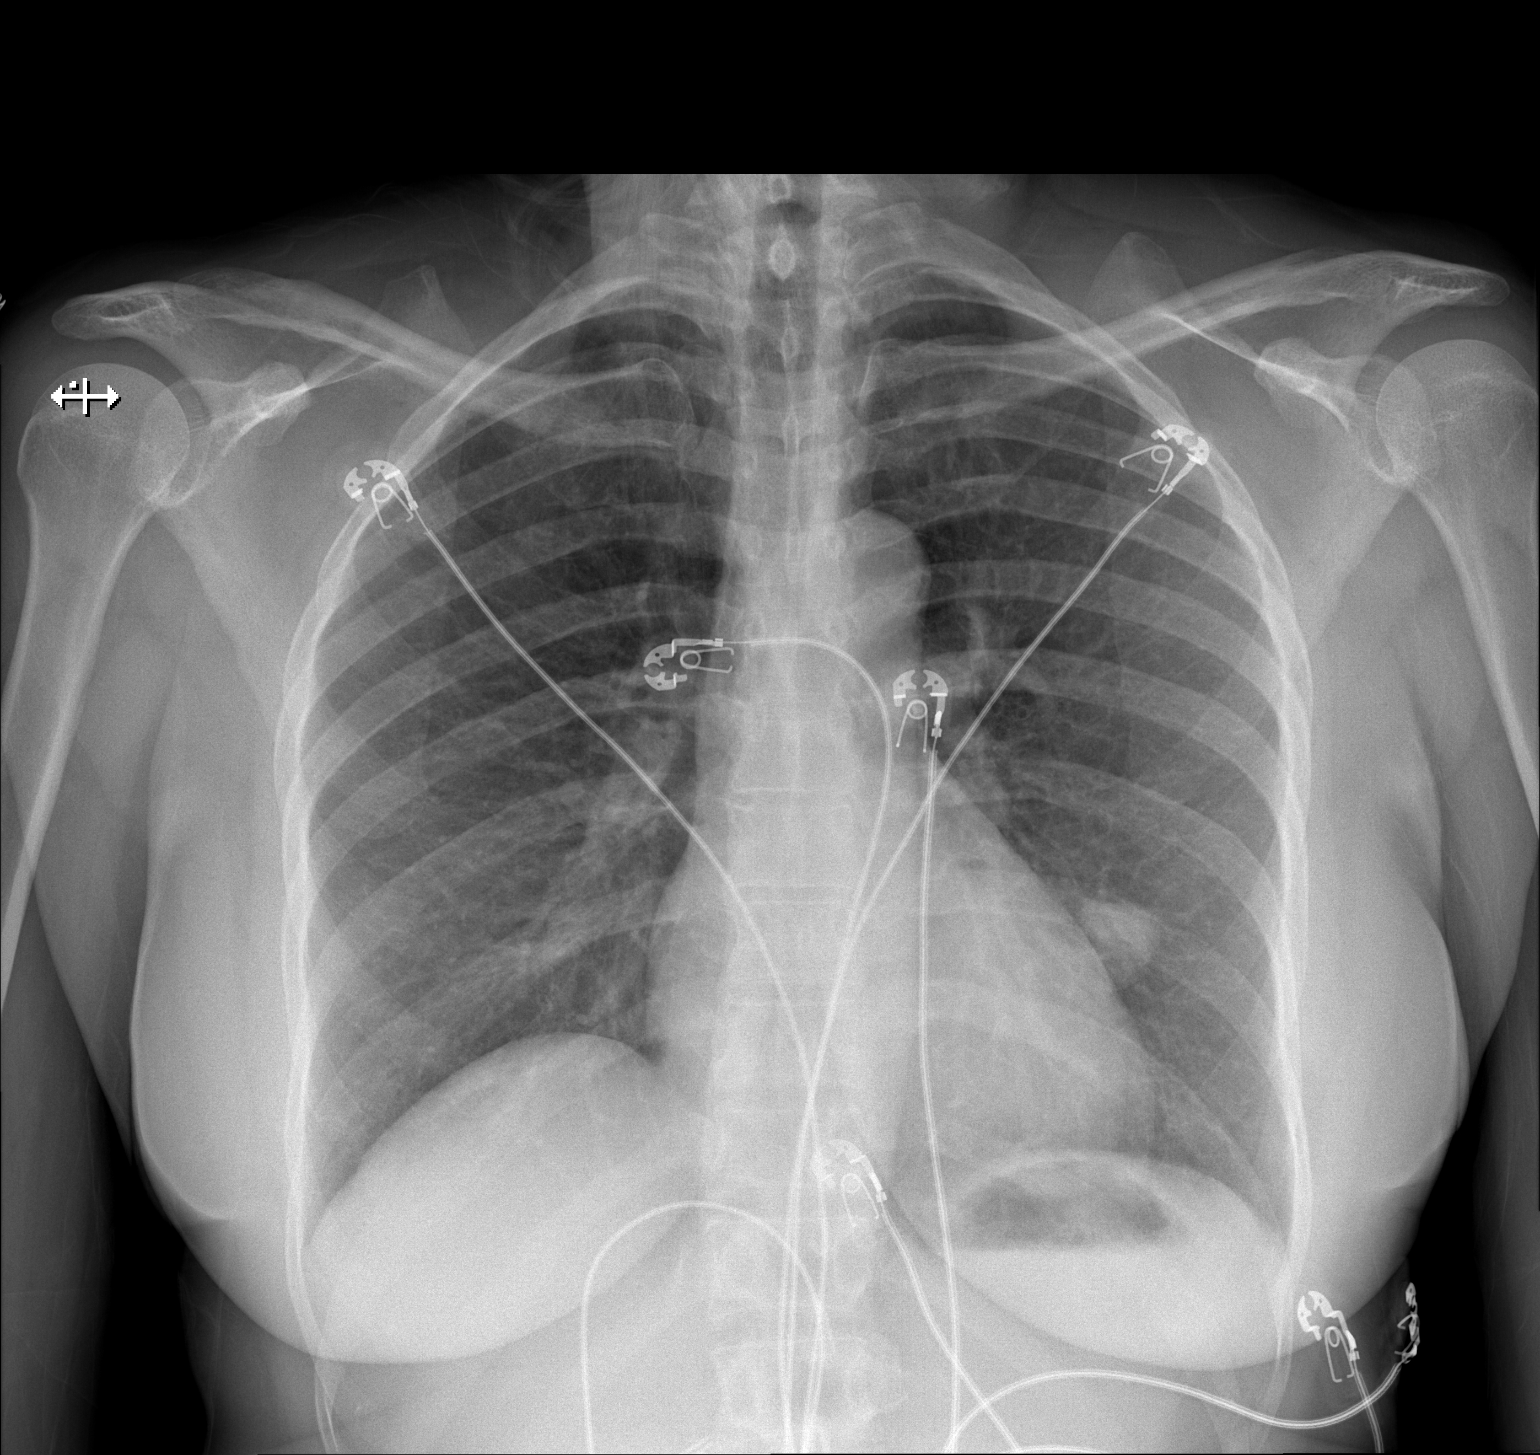

[w chest lat]
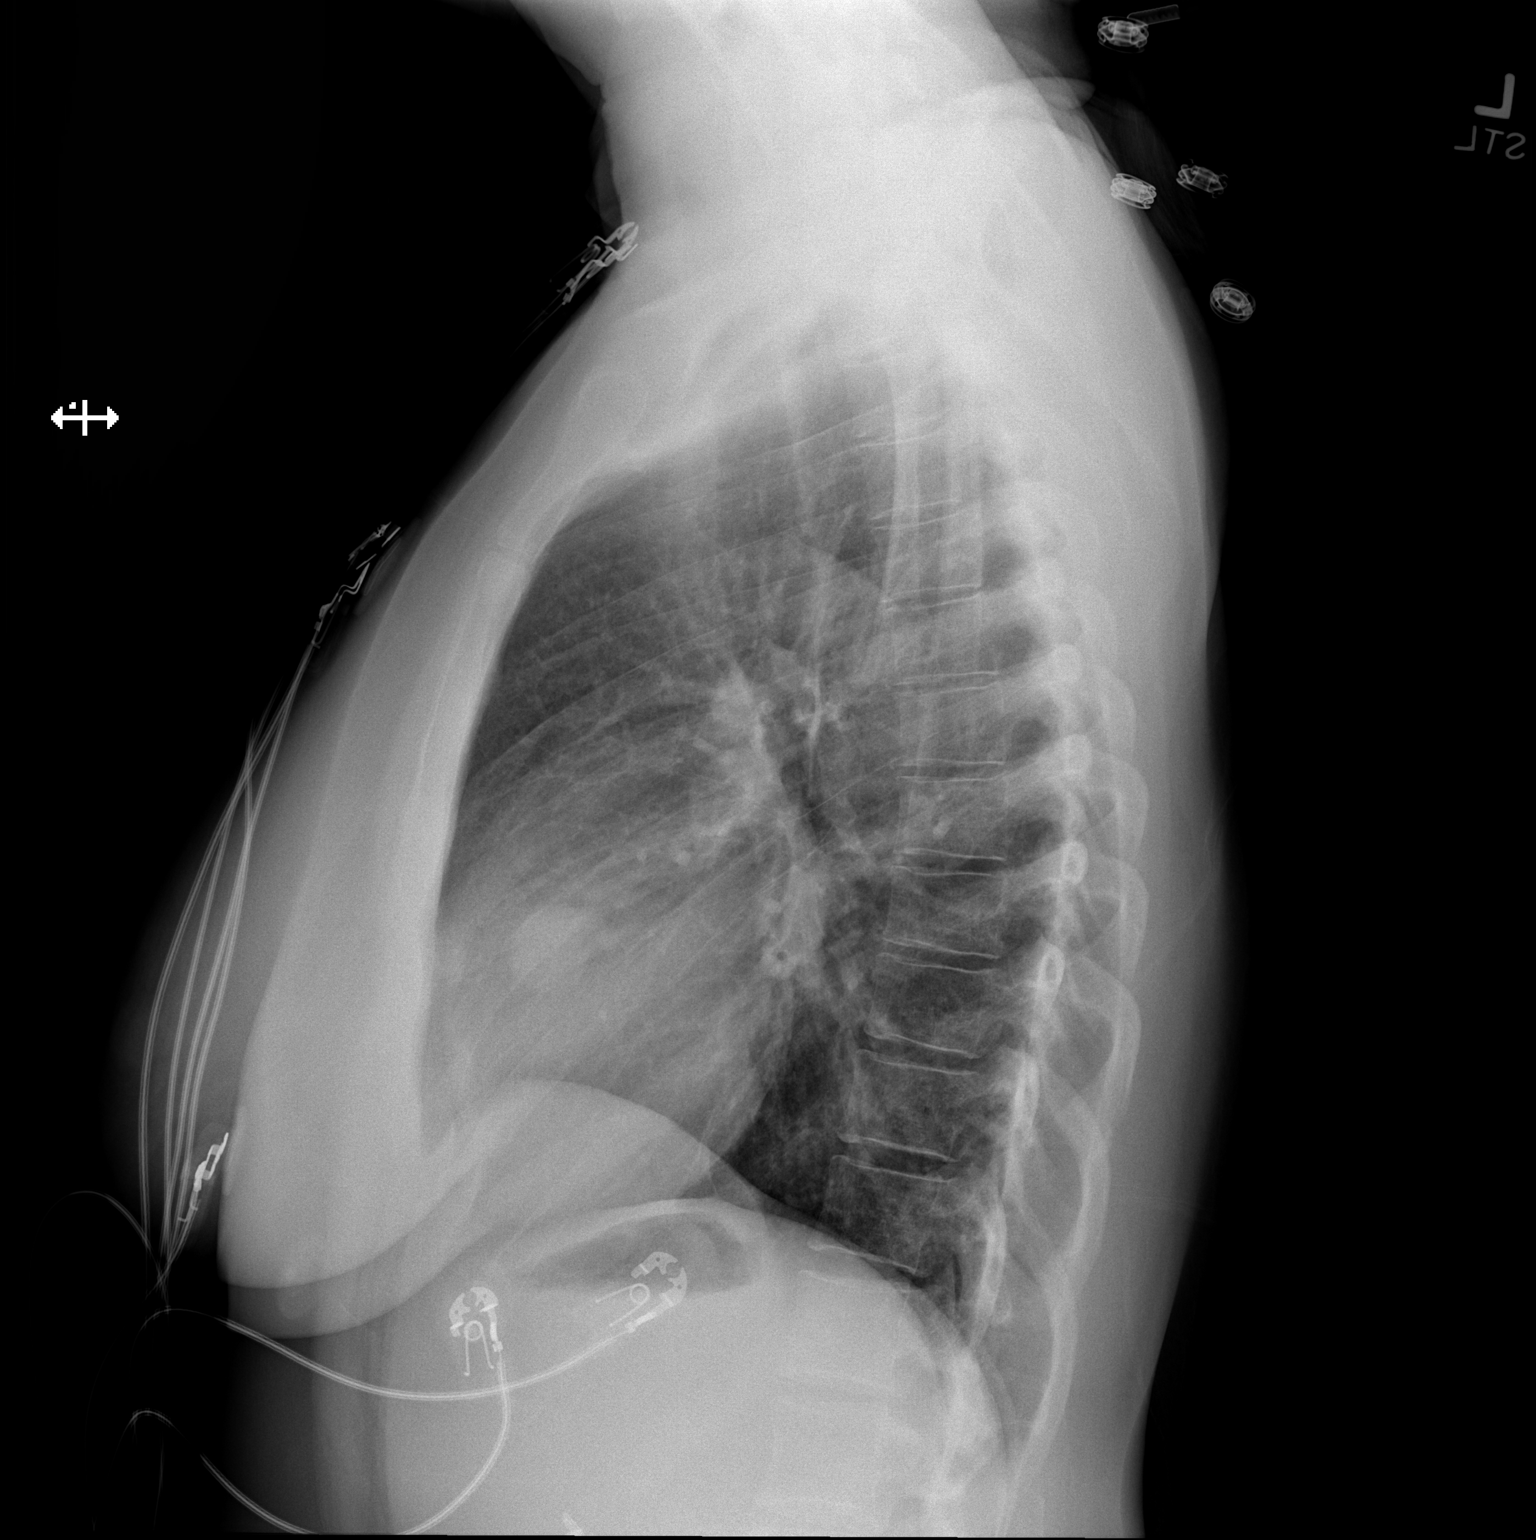

[2 of 2 positions shown; findings below may reference images not displayed]

FINDINGS: The lungs are well-aerated. There is a 2.4 cm nodule near the left
lung base. Though this could reflect pulmonary sarcoidosis,
malignancy cannot be excluded. There is no evidence of pleural
effusion or pneumothorax.

The cardiomediastinal silhouette is within normal limits. No acute
osseous abnormalities are seen.
IMPRESSION: 2.4 cm nodule noted near the left lung base. Though this could
reflect pulmonary sarcoidosis given the patient's history,
malignancy cannot be excluded. CT of the chest is recommended for
further evaluation.

## 2017-01-24 IMAGING — CT CT CHEST W/ CM
2 of 4 series · 15 of 36 positions shown, 18 images · IV contrast (omnipaque)
Comparison: Chest radiograph December 04, 2014 at 2666 hours

CLINICAL DATA: Follow-up nodule in chest radiograph. History of
sarcoidosis.

EXAM:
CT CHEST WITH CONTRAST
TECHNIQUE: Multidetector CT imaging of the chest was performed during
intravenous contrast administration.
CONTRAST:  75mL OMNIPAQUE IOHEXOL 300 MG/ML  SOLN

[Series 2: chest with st · axial · 0.55mm/px · z∈[-262,-22]mm · 12 of 56 slices shown, 15 images]
[im 4/56  mediastinal]
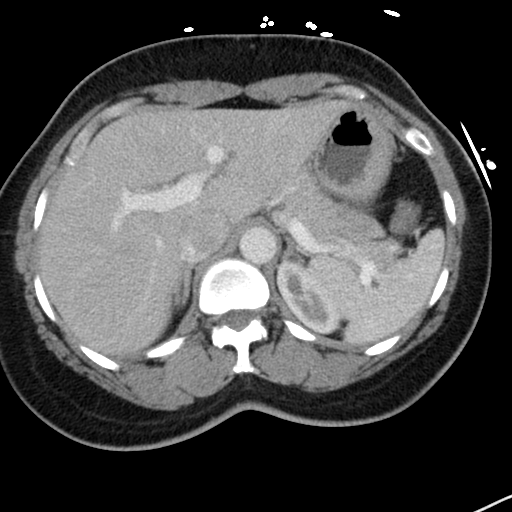
[im 4/56  lung]
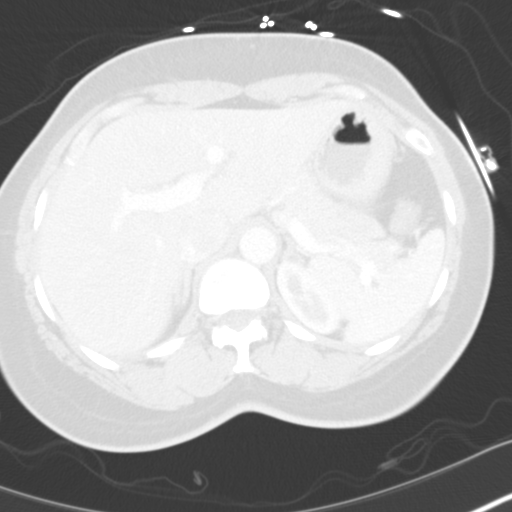
[im 8/56  lung]
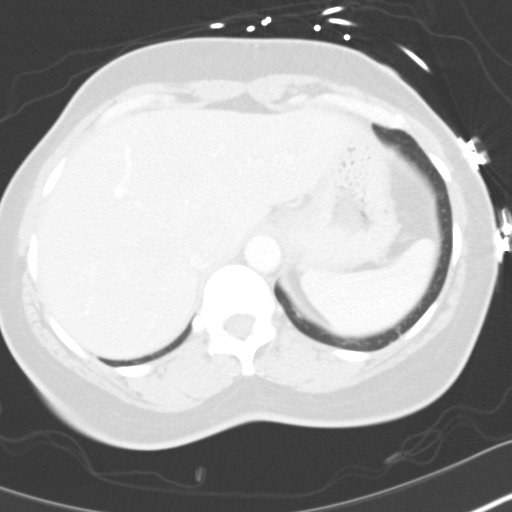
[im 12/56  lung]
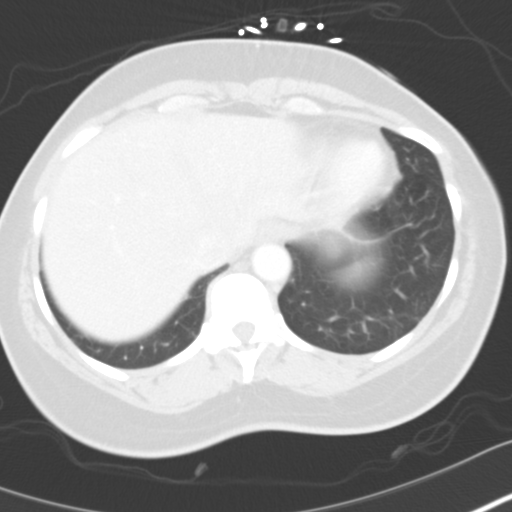
[im 16/56  lung]
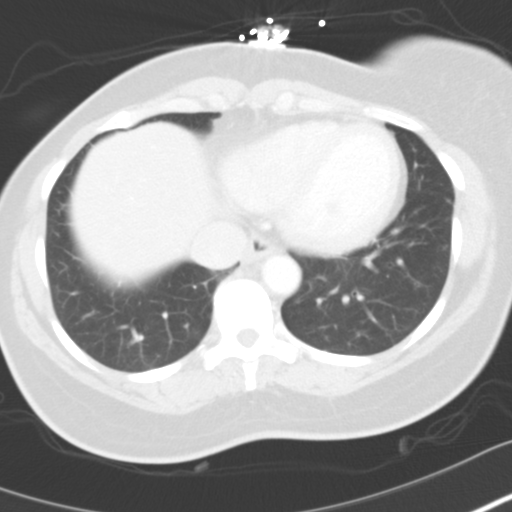
[im 20/56  mediastinal]
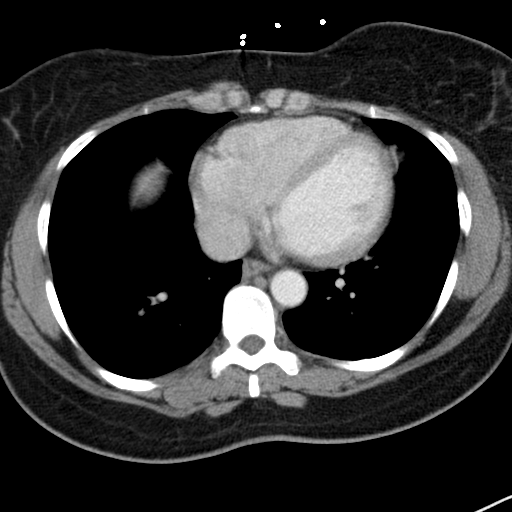
[im 20/56  lung]
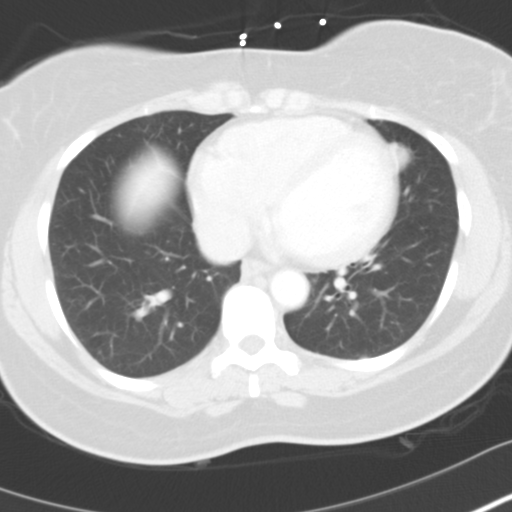
[im 24/56  lung]
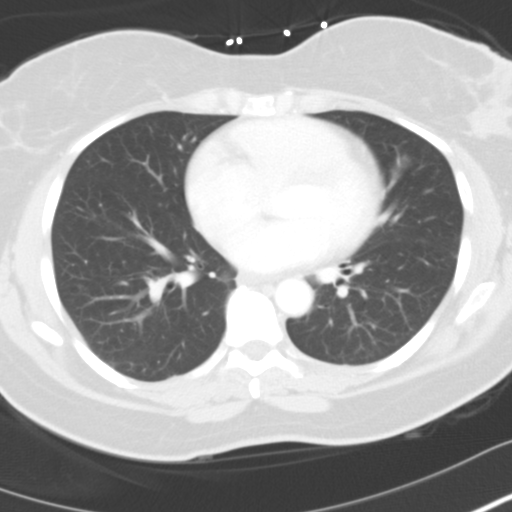
[im 32/56  lung]
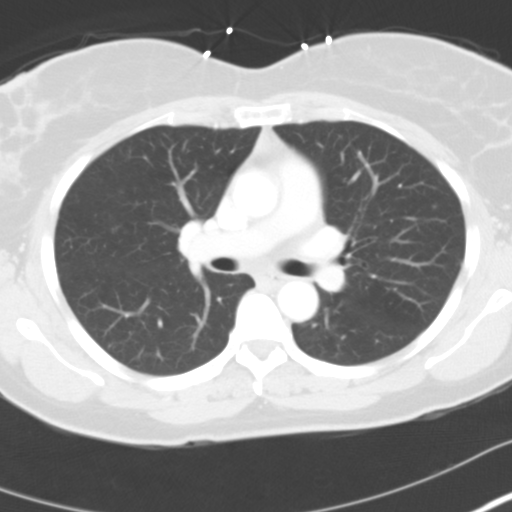
[im 36/56  lung]
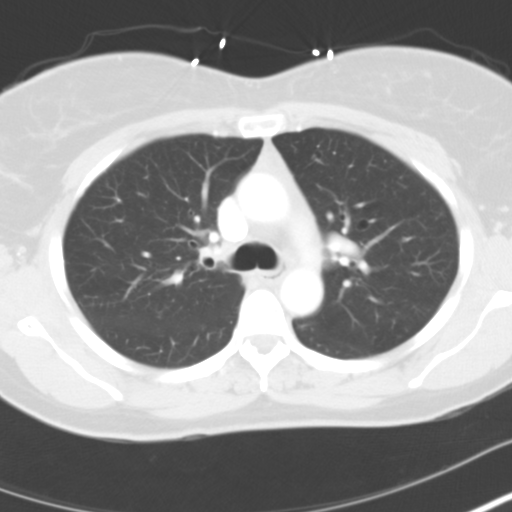
[im 40/56  mediastinal]
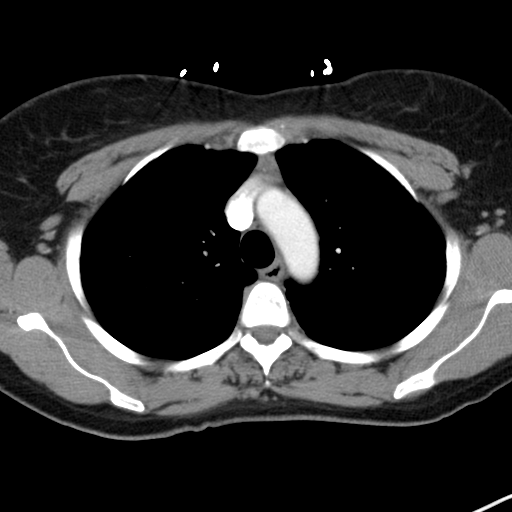
[im 40/56  lung]
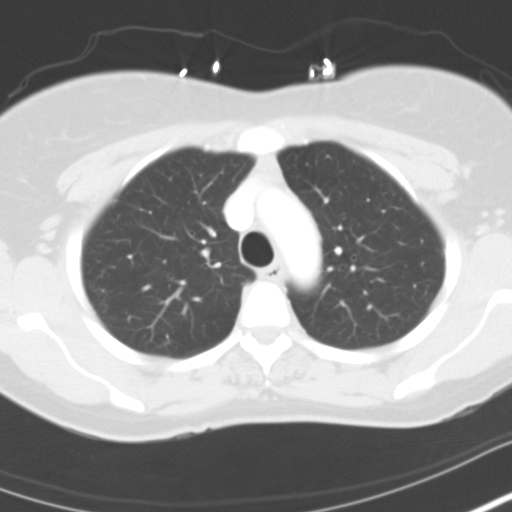
[im 44/56  lung]
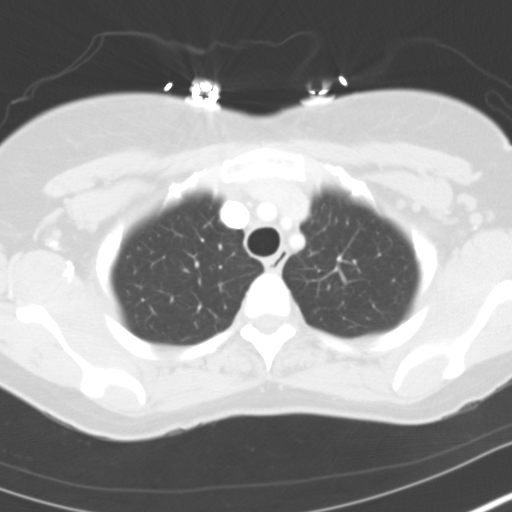
[im 48/56  lung]
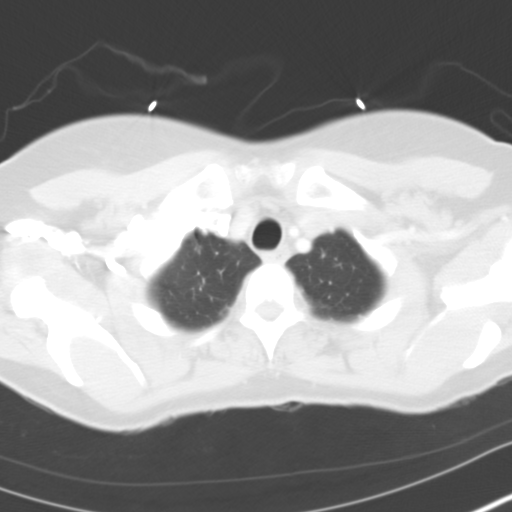
[im 52/56  lung]
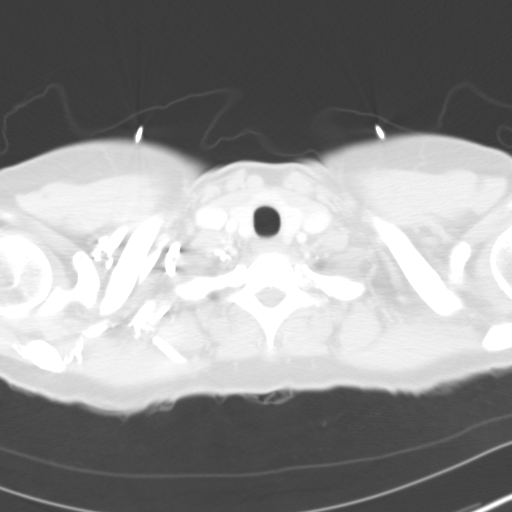

[Series 6: coronals · coronal · 0.59mm/px · 3 of 85 slices shown]
[im 17/85  lung]
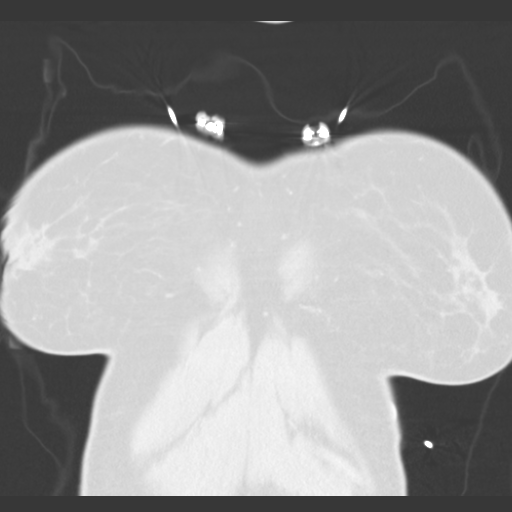
[im 34/85  lung]
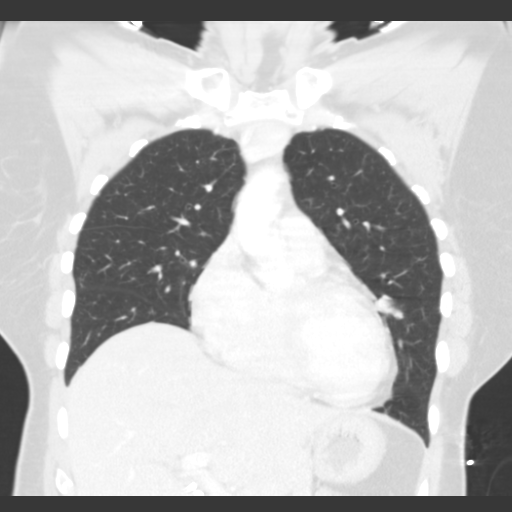
[im 51/85  lung]
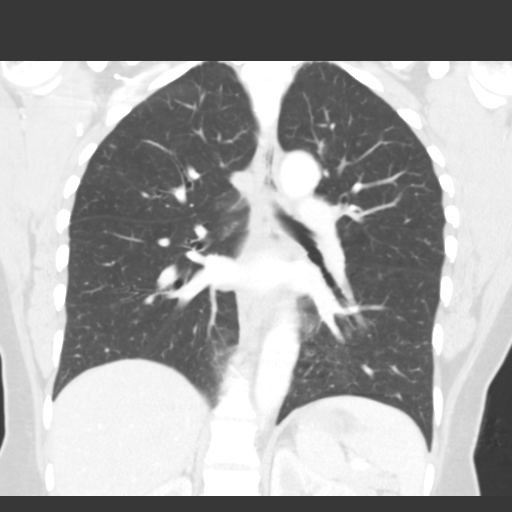

[15 of 36 positions shown; findings below may reference images not displayed]

FINDINGS: MEDIASTINUM: Heart and pericardium are unremarkable. Thoracic aorta
is normal course and caliber, unremarkable. No lymphadenopathy by CT
size criteria.

LUNGS: Tracheobronchial tree is patent, no pneumothorax. No pleural
effusions, focal consolidations. Smoothly marginated 21 x 17 mm
enhancing lingular nodule abutting the pericardium.

SOFT TISSUES AND OSSEOUS STRUCTURES: Included view of the abdomen is
not acute; status post cholecystectomy. Visualized soft tissues and
included osseous structures appear normal.
IMPRESSION: Solitary 21 x 17 mm lingular pulmonary nodule. Though this could
represent sarcoidosis, recommend follow-up CT at around 3, 9 and 24
months, dynamic contrast-enhanced CT, PET and/or biopsy.

No acute cardiopulmonary process.

## 2017-02-11 ENCOUNTER — Other Ambulatory Visit: Payer: Self-pay | Admitting: Family Medicine

## 2017-02-11 DIAGNOSIS — Z1231 Encounter for screening mammogram for malignant neoplasm of breast: Secondary | ICD-10-CM

## 2017-02-28 ENCOUNTER — Ambulatory Visit
Admission: RE | Admit: 2017-02-28 | Discharge: 2017-02-28 | Disposition: A | Discharge status: Home / Self Care | Payer: BLUE CROSS/BLUE SHIELD | Source: Ambulatory Visit | Attending: Family Medicine | Admitting: Family Medicine

## 2017-02-28 DIAGNOSIS — Z1231 Encounter for screening mammogram for malignant neoplasm of breast: Secondary | ICD-10-CM

## 2018-03-09 ENCOUNTER — Other Ambulatory Visit: Payer: Self-pay | Admitting: Family Medicine

## 2018-03-09 DIAGNOSIS — Z1231 Encounter for screening mammogram for malignant neoplasm of breast: Secondary | ICD-10-CM

## 2018-04-24 ENCOUNTER — Inpatient Hospital Stay: Admission: RE | Admit: 2018-04-24 | Payer: BLUE CROSS/BLUE SHIELD | Source: Ambulatory Visit

## 2018-06-05 ENCOUNTER — Ambulatory Visit: Payer: Self-pay

## 2018-07-22 ENCOUNTER — Ambulatory Visit
Admission: RE | Admit: 2018-07-22 | Discharge: 2018-07-22 | Disposition: A | Discharge status: Home / Self Care | Payer: 59 | Source: Ambulatory Visit | Attending: Family Medicine | Admitting: Family Medicine

## 2018-07-22 ENCOUNTER — Other Ambulatory Visit: Payer: Self-pay

## 2018-07-22 DIAGNOSIS — Z1231 Encounter for screening mammogram for malignant neoplasm of breast: Secondary | ICD-10-CM

## 2018-12-07 ENCOUNTER — Other Ambulatory Visit (HOSPITAL_COMMUNITY)
Admission: RE | Admit: 2018-12-07 | Discharge: 2018-12-07 | Disposition: A | Discharge status: Home / Self Care | Payer: 59 | Source: Ambulatory Visit | Attending: Physician Assistant | Admitting: Physician Assistant

## 2018-12-07 DIAGNOSIS — Z0000 Encounter for general adult medical examination without abnormal findings: Secondary | ICD-10-CM | POA: Insufficient documentation

## 2019-09-20 ENCOUNTER — Other Ambulatory Visit: Payer: Self-pay | Admitting: Family Medicine

## 2019-09-20 DIAGNOSIS — Z1231 Encounter for screening mammogram for malignant neoplasm of breast: Secondary | ICD-10-CM

## 2019-09-29 ENCOUNTER — Other Ambulatory Visit: Payer: Self-pay

## 2019-09-29 ENCOUNTER — Ambulatory Visit
Admission: RE | Admit: 2019-09-29 | Discharge: 2019-09-29 | Disposition: A | Discharge status: Home / Self Care | Payer: 59 | Source: Ambulatory Visit | Attending: Family Medicine | Admitting: Family Medicine

## 2019-09-29 DIAGNOSIS — Z1231 Encounter for screening mammogram for malignant neoplasm of breast: Secondary | ICD-10-CM

## 2020-05-22 ENCOUNTER — Other Ambulatory Visit: Payer: Self-pay

## 2020-05-22 ENCOUNTER — Emergency Department (HOSPITAL_COMMUNITY)
Admission: EM | Admit: 2020-05-22 | Discharge: 2020-05-22 | Disposition: A | Discharge status: Home / Self Care | Payer: 59 | Attending: Emergency Medicine | Admitting: Emergency Medicine

## 2020-05-22 ENCOUNTER — Emergency Department (HOSPITAL_COMMUNITY): Payer: 59

## 2020-05-22 DIAGNOSIS — I4891 Unspecified atrial fibrillation: Secondary | ICD-10-CM | POA: Diagnosis present

## 2020-05-22 LAB — CBC
HCT: 39 % (ref 36.0–46.0)
Hemoglobin: 13 g/dL (ref 12.0–15.0)
MCH: 29.6 pg (ref 26.0–34.0)
MCHC: 33.3 g/dL (ref 30.0–36.0)
MCV: 88.8 fL (ref 80.0–100.0)
Platelets: 233 10*3/uL (ref 150–400)
RBC: 4.39 MIL/uL (ref 3.87–5.11)
RDW: 13.1 % (ref 11.5–15.5)
WBC: 6.4 10*3/uL (ref 4.0–10.5)
nRBC: 0 % (ref 0.0–0.2)

## 2020-05-22 LAB — BASIC METABOLIC PANEL
Anion gap: 7 (ref 5–15)
BUN: 15 mg/dL (ref 6–20)
CO2: 26 mmol/L (ref 22–32)
Calcium: 9.2 mg/dL (ref 8.9–10.3)
Chloride: 103 mmol/L (ref 98–111)
Creatinine, Ser: 0.91 mg/dL (ref 0.44–1.00)
GFR, Estimated: >60 mL/min (ref >60)
Glucose, Bld: 130 mg/dL — ABNORMAL HIGH (ref 70–99)
Potassium: 4.1 mmol/L (ref 3.5–5.1)
Sodium: 136 mmol/L (ref 135–145)

## 2020-05-22 LAB — MAGNESIUM: Magnesium: 1.9 mg/dL (ref 1.7–2.4)

## 2020-05-22 MED ORDER — APIXABAN 5 MG PO TABS
5.0000 mg | ORAL_TABLET | Freq: Once | ORAL | Status: AC
  Administered 2020-05-22: 5 mg via ORAL
  Filled 2020-05-22: qty 1

## 2020-05-22 MED ORDER — SODIUM CHLORIDE 0.9 % IV BOLUS
1000.0000 mL | Freq: Once | INTRAVENOUS | Status: AC
  Administered 2020-05-22: 1000 mL via INTRAVENOUS

## 2020-05-22 MED ORDER — ETOMIDATE 2 MG/ML IV SOLN
10.0000 mg | Freq: Once | INTRAVENOUS | Status: AC
  Administered 2020-05-22: 10 mg via INTRAVENOUS
  Filled 2020-05-22: qty 10

## 2020-05-22 MED ORDER — APIXABAN 5 MG PO TABS: 5.0000 mg | ORAL_TABLET | Freq: Two times a day (BID) | ORAL | 0 refills | Status: DC

## 2020-05-22 NOTE — Discharge Instructions (Addendum)
You were evaluated in the Emergency Department and after careful evaluation, we did not find any emergent condition requiring admission or further testing in the hospital.  Your exam/testing today was overall reassuring.  Symptoms seem to be due to atrial fibrillation.  We performed a cardioversion procedure here in the emergency department and your heart rhythm is now back to normal.  We are referring you to the cardiology A. fib clinic for future management of this condition.  Until then please take the Eliquis blood thinner as directed.  Please return to the Emergency Department if you experience any worsening of your condition.  Thank you for allowing [place] to be a part of your care.

## 2020-05-22 NOTE — ED Triage Notes (Signed)
Pt bib EMS from home due to "funny feeling" in chest. Pt was found to be in Afib rvr with a rate between 90-210. Pt has no hx of a-fib. 12 lead unremarkable.  Hypertensive at 160/90  20 gauge placed in left AC.

## 2020-05-22 NOTE — Progress Notes (Signed)
Present for conscious sedation.  

## 2020-05-22 NOTE — ED Provider Notes (Addendum)
[facility] Emergency Department Provider Note MRN:  [id number]  Arrival date & time: 05/22/20     Chief Complaint   Atrial Fibrillation   History of Present Illness   Christina Mclaughlin is a 60 y.o. year-old female with a history of mitral valve prolapse presenting to the ED with chief complaint of A. fib.  Patient got out of bed this evening to change the thermostat settings, got back in bed and realized that she was experiencing palpitations with an elevated heart rate.  Try to do some deep breathing and go back to sleep but the symptoms would not go away.  No history of A. fib.  Denies any chest pain or shortness of breath, no recent fever or illness, normal diet, no other complaints.  Review of Systems  A complete 10 system review of systems was obtained and all systems are negative except as noted in the HPI and PMH.   Patient's Health History    Past Medical History:  Diagnosis Date  . MVP (mitral valve prolapse)   . Sarcoidosis     Past Surgical History:  Procedure Laterality Date  . BREAST EXCISIONAL BIOPSY Left over 30 years   x 2 benign  . CESAREAN SECTION    . CHOLECYSTECTOMY    . COLPOSCOPY VULVA W/ BIOPSY      Family History  Problem Relation Age of Onset  . Stroke Mother   . Dementia Mother   . Heart failure Father   . Stroke Father   . Heart failure Sister   . Cancer Paternal Aunt        ? breast  . Sarcoidosis Sister     Social History   Socioeconomic History  . Marital status: Single    Spouse name: Not on file  . Number of children: Not on file  . Years of education: Not on file  . Highest education level: Not on file  Occupational History  . Not on file  Tobacco Use  . Smoking status: Never Smoker  . Smokeless tobacco: Never Used  Substance and Sexual Activity  . Alcohol use: No  . Drug use: No  . Sexual activity: Not on file  Other Topics Concern  . Not on file  Social History Narrative  . Not on file    Social Determinants of Health   Financial Resource Strain: Not on file  Food Insecurity: Not on file  Transportation Needs: Not on file  Physical Activity: Not on file  Stress: Not on file  Social Connections: Not on file  Intimate Partner Violence: Not on file     Physical Exam   Vitals:   05/22/20 0315 05/22/20 0346  BP: (!) 143/80 (!) 138/102  Pulse: (!) 133 (!) 139  Resp: (!) 23 20  Temp:    SpO2: 100% 100%    CONSTITUTIONAL: Well-appearing, NAD NEURO:  Alert and oriented x 3, no focal deficits EYES:  eyes equal and reactive ENT/NECK:  no LAD, no JVD CARDIO: Tachycardic and irregular rate, well-perfused, normal S1 and S2 PULM:  CTAB no wheezing or rhonchi GI/GU:  normal bowel sounds, non-distended, non-tender MSK/SPINE:  No gross deformities, no edema SKIN:  no rash, atraumatic PSYCH:  Appropriate speech and behavior  *Additional and/or pertinent findings included in MDM below  Diagnostic and Interventional Summary    EKG Interpretation  Date/Time:  Monday May 22 2020 02:29:18 EDT Ventricular Rate:  121 PR Interval:    QRS Duration: 82 QT Interval:  298  QTC Calculation: 427 R Axis:   58 Text Interpretation: Atrial fibrillation Nonspecific repol abnormality, diffuse leads Confirmed by Kennis Carina [phone]) on 05/22/2020 3:15:31 AM       EKG Interpretation  Date/Time:  Monday May 22 2020 03:49:16 EDT Ventricular Rate:  83 PR Interval:  194 QRS Duration: 78 QT Interval:  357 QTC Calculation: 420 R Axis:   50 Text Interpretation: Sinus rhythm Confirmed by Kennis Carina [phone]) on 05/22/2020 3:52:40 AM       Labs Reviewed  CBC  BASIC METABOLIC PANEL  MAGNESIUM    DG Chest Port 1 View  Final Result      Medications  etomidate (AMIDATE) injection 10 mg (10 mg Intravenous Given by Other 05/22/20 0347)  sodium chloride 0.9 % bolus 1,000 mL (1,000 mLs Intravenous New Bag/Given 05/22/20 0303)     Procedures  /  Critical Care .Critical  Care Performed by: Sabas Sous, MD Authorized by: Sabas Sous, MD   Critical care provider statement:    Critical care time (minutes):  45   Critical care was necessary to treat or prevent imminent or life-threatening deterioration of the following conditions: Afib with RVR.   Critical care was time spent personally by me on the following activities:  Discussions with consultants, evaluation of patient's response to treatment, examination of patient, ordering and performing treatments and interventions, ordering and review of laboratory studies, ordering and review of radiographic studies, pulse oximetry, re-evaluation of patient's condition, obtaining history from patient or surrogate and review of old charts .Sedation  Date/Time: 05/22/2020 3:25 AM Performed by: Sabas Sous, MD Authorized by: Sabas Sous, MD   Consent:    Consent obtained:  Verbal and written   Consent given by:  Patient   Risks discussed:  Allergic reaction, dysrhythmia, inadequate sedation, nausea, vomiting, respiratory compromise necessitating ventilatory assistance and intubation and prolonged hypoxia resulting in organ damage Universal protocol:    Immediately prior to procedure, a time out was called: yes     Patient identity confirmed:  Arm band, hospital-assigned identification number, verbally with patient and provided demographic data Indications:    Procedure performed:  Cardioversion   Procedure necessitating sedation performed by:  Physician performing sedation Pre-sedation assessment:    Time since last food or drink:  6 hours   ASA classification: class 1 - normal, healthy patient     Mouth opening:  3 or more finger widths   Mallampati score:  I - soft palate, uvula, fauces, pillars visible   Neck mobility: normal     Pre-sedation assessments completed and reviewed: airway patency, cardiovascular function, hydration status, mental status, nausea/vomiting, pain level, respiratory function  and temperature   Immediate pre-procedure details:    Reviewed: vital signs, relevant labs/tests and NPO status     Verified: bag valve mask available, emergency equipment available, intubation equipment available, IV patency confirmed, oxygen available and suction available   Procedure details (see MAR for exact dosages):    Preoxygenation:  Nasal cannula   Sedation:  Etomidate   Intended level of sedation: deep   Intra-procedure monitoring:  Blood pressure monitoring, cardiac monitor, continuous capnometry, continuous pulse oximetry, frequent LOC assessments and frequent vital sign checks   Intra-procedure events: none     Total Provider sedation time (minutes):  17 Post-procedure details:    Attendance: Constant attendance by certified staff until patient recovered     Recovery: Patient returned to pre-procedure baseline     Post-sedation assessments completed and reviewed: airway  patency, cardiovascular function, hydration status, mental status, nausea/vomiting, pain level, respiratory function and temperature     Patient is stable for discharge or admission: yes     Procedure completion:  Tolerated well, no immediate complications .Cardioversion  Date/Time: 05/22/2020 3:26 AM Performed by: Sabas Sous, MD Authorized by: Sabas Sous, MD   Consent:    Consent obtained:  Verbal and written   Consent given by:  Patient   Risks discussed:  Cutaneous burn, death, induced arrhythmia and pain Pre-procedure details:    Cardioversion basis:  Elective   Rhythm:  Atrial fibrillation   Electrode placement:  Anterior-posterior Patient sedated: Yes. Refer to sedation procedure documentation for details of sedation.  Attempt one:    Cardioversion mode:  Synchronous   Waveform:  Biphasic   Shock (Joules):  100   Shock outcome:  Conversion to normal sinus rhythm Post-procedure details:    Patient status:  Awake   Patient tolerance of procedure:  Tolerated well, no immediate  complications    ED Course and Medical Decision Making  I have reviewed the triage vital signs, the nursing notes, and pertinent available records from the EMR.  Listed above are laboratory and imaging tests that I personally ordered, reviewed, and interpreted and then considered in my medical decision making (see below for details).  Patient is in new onset A. fib with RVR, on my exam ranging between 120 and 140 heart rate.  Normal blood pressure.  In no acute distress.  No obvious triggers or underlying causes, awaiting labs, chest x-ray.  We discussed management options and we will proceed with cardioversion.     CHA2DS2-VASc Score = 1  This indicates a 0.6% annual risk of stroke. The patient's score is based upon: CHF History: No HTN History: No Diabetes History: No Stroke History: No Vascular Disease History: No Age Score: 0 Gender Score: 1       See procedure details above, successful cardioversion.  Will refer to A. fib clinic, provide short course Eliquis which may be discontinued by cardiology given her low CHA2DS2-VASc.  Patient counseled about anticoagulation, she is not a fall risk, she is made aware of the risk of bleeding.  Elmer Sow. Pilar Plate, MD [facility] Emergency Medicine [facility] [email or web address]  Final Clinical Impressions(s) / ED Diagnoses     ICD-10-CM   1. Atrial fibrillation with RVR (HCC)  I48.91     ED Discharge Orders    None       Discharge Instructions Discussed with and Provided to Patient:   Discharge Instructions   None       Sabas Sous, MD 05/22/20 [id number]    Sabas Sous, MD 05/22/20 [phone]

## 2020-05-24 NOTE — Progress Notes (Signed)
Primary Care Physician: Elias Else, MD Primary Cardiologist: none Primary Electrophysiologist: none Referring Physician: [facility]   Christina Mclaughlin is a 60 y.o. female with a history of sarcoidosis, MVP and atrial fibrillation who presents for consultation in the [facility]. The patient was initially diagnosed with atrial fibrillation 05/22/20 after presenting to the ED with symptoms of tachypalpitations. ECG showed afib with RVR. She underwent successful DCCV at that time. Patient was started on Eliquis for a CHADS2VASC score of 1. She has not had further heart racing. She denies any alcohol use but does admit to snoring and witnessed apnea. Several of her immediate family members have been diagnosed with OSA.  Today, she denies symptoms of palpitations, chest pain, shortness of breath, orthopnea, PND, lower extremity edema, dizziness, presyncope, syncope, bleeding, or neurologic sequela. The patient is tolerating medications without difficulties and is otherwise without complaint today.    Atrial Fibrillation Risk Factors:  she does have symptoms or diagnosis of sleep apnea. she does not have a history of rheumatic fever. she does not have a history of alcohol use. The patient does not have a history of early familial atrial fibrillation or other arrhythmias.  she has a BMI of Body mass index is 28.68 kg/m.Marland Kitchen Filed Weights   05/25/20 0903  Weight: 71.1 kg    Family History  Problem Relation Age of Onset  . Stroke Mother   . Dementia Mother   . Heart failure Father   . Stroke Father   . Heart failure Sister   . Cancer Paternal Aunt        ? breast  . Sarcoidosis Sister      Atrial Fibrillation Management history:  Previous antiarrhythmic drugs: none Previous cardioversions: 05/22/20 Previous ablations: none CHADS2VASC score: 1 Anticoagulation history: Eliquis   Past Medical History:  Diagnosis Date  . MVP (mitral valve  prolapse)   . Sarcoidosis    Past Surgical History:  Procedure Laterality Date  . BREAST EXCISIONAL BIOPSY Left over 30 years   x 2 benign  . CESAREAN SECTION    . CHOLECYSTECTOMY    . COLPOSCOPY VULVA W/ BIOPSY      Current Outpatient Medications  Medication Sig Dispense Refill  . albuterol (VENTOLIN HFA) 108 (90 Base) MCG/ACT inhaler Inhale 1-2 puffs into the lungs every 6 (six) hours as needed for wheezing or shortness of breath.    Marland Kitchen apixaban (ELIQUIS) 5 MG TABS tablet Take 1 tablet (5 mg total) by mouth 2 (two) times daily. 60 tablet 0  . Calcium-Vitamin D-Vitamin K (VIACTIV CALCIUM PLUS D PO) Take 1 tablet by mouth daily.    . ferrous sulfate 325 (65 FE) MG tablet Take 325 mg by mouth at bedtime.    . Multiple Vitamin (MULITIVITAMIN WITH MINERALS) TABS Take 1 tablet by mouth daily.    . pravastatin (PRAVACHOL) 40 MG tablet Take 40 mg by mouth at bedtime.     No current facility-administered medications for this encounter.    No Known Allergies  Social History   Socioeconomic History  . Marital status: Single    Spouse name: Not on file  . Number of children: Not on file  . Years of education: Not on file  . Highest education level: Not on file  Occupational History  . Not on file  Tobacco Use  . Smoking status: Never Smoker  . Smokeless tobacco: Never Used  Substance and Sexual Activity  . Alcohol use: No  .  Drug use: No  . Sexual activity: Not on file  Other Topics Concern  . Not on file  Social History Narrative  . Not on file   Social Determinants of Health   Financial Resource Strain: Not on file  Food Insecurity: Not on file  Transportation Needs: Not on file  Physical Activity: Not on file  Stress: Not on file  Social Connections: Not on file  Intimate Partner Violence: Not on file     ROS- All systems are reviewed and negative except as per the HPI above.  Physical Exam: Vitals:   05/25/20 0903  BP: 124/70  Pulse: 60  Weight: 71.1 kg   Height: 5\' 2"  (1.575 m)    GEN- The patient is a well appearing female, alert and oriented x 3 today.   Head- normocephalic, atraumatic Eyes-  Sclera clear, conjunctiva pink Ears- hearing intact Oropharynx- clear Neck- supple  Lungs- Clear to ausculation bilaterally, normal work of breathing Heart- Regular rate and rhythm, no murmurs, rubs or gallops  GI- soft, NT, ND, + BS Extremities- no clubbing, cyanosis, or edema MS- no significant deformity or atrophy Skin- no rash or lesion Psych- euthymic mood, full affect Neuro- strength and sensation are intact  Wt Readings from Last 3 Encounters:  05/25/20 71.1 kg  01/09/15 75.8 kg  12/12/14 74.1 kg    EKG today demonstrates  SR Vent. rate 60 BPM PR interval 194 ms QRS duration 68 ms QT/QTcB 410/410 ms  Epic records are reviewed at length today  CHA2DS2-VASc Score = 1  The patient's score is based upon: CHF History: No HTN History: No Diabetes History: No Stroke History: No Vascular Disease History: No Age Score: 0 Gender Score: 1      ASSESSMENT AND PLAN: 1. Paroxysmal Atrial Fibrillation (ICD10:  I48.0) The patient's CHA2DS2-VASc score is 1, indicating a 0.6% annual risk of stroke.   General education about afib provided and questions answered. We also discussed her stroke risk and the risks and benefits of anticoagulation. Check echocardiogram Continue Eliquis 5 mg BID for at least 4 weeks post DCCV. Will d/c at that time with low CV score.  2. Snoring/witnessed apnea The importance of adequate treatment of sleep apnea was discussed today in order to improve our ability to maintain sinus rhythm long term. She has a strong family hx of OSA and symptoms of witnessed apnea.  Will refer to Dr 13/07/16.  3. MVP Patient reports h/o same. Check echo as above.   Follow up in the AF clinic for echo and office visit in about one month.   Earl Gala PA-C Afib Clinic [facility] [address] [place], [place] [phone] 05/25/2020 9:09 AM

## 2020-05-25 ENCOUNTER — Other Ambulatory Visit: Payer: Self-pay

## 2020-05-25 ENCOUNTER — Encounter (HOSPITAL_COMMUNITY): Payer: Self-pay | Admitting: Physician Assistant

## 2020-05-25 ENCOUNTER — Ambulatory Visit (HOSPITAL_COMMUNITY)
Admission: RE | Admit: 2020-05-25 | Discharge: 2020-05-25 | Disposition: A | Discharge status: Home / Self Care | Payer: 59 | Source: Ambulatory Visit | Attending: Physician Assistant | Admitting: Physician Assistant

## 2020-05-25 VITALS — BP 124/70 | HR 60 | Ht 62.0 in | Wt 156.8 lb

## 2020-05-25 DIAGNOSIS — Z7901 Long term (current) use of anticoagulants: Secondary | ICD-10-CM | POA: Diagnosis not present

## 2020-05-25 DIAGNOSIS — D869 Sarcoidosis, unspecified: Secondary | ICD-10-CM | POA: Insufficient documentation

## 2020-05-25 DIAGNOSIS — I480 Paroxysmal atrial fibrillation: Secondary | ICD-10-CM | POA: Insufficient documentation

## 2020-05-25 DIAGNOSIS — R0683 Snoring: Secondary | ICD-10-CM | POA: Diagnosis not present

## 2020-05-25 DIAGNOSIS — I341 Nonrheumatic mitral (valve) prolapse: Secondary | ICD-10-CM | POA: Diagnosis not present

## 2020-05-25 NOTE — Patient Instructions (Signed)
Can stop eliquis when current prescription runs out.

## 2020-07-06 ENCOUNTER — Encounter (HOSPITAL_COMMUNITY): Payer: Self-pay | Admitting: Physician Assistant

## 2020-07-06 ENCOUNTER — Ambulatory Visit (HOSPITAL_BASED_OUTPATIENT_CLINIC_OR_DEPARTMENT_OTHER)
Admission: RE | Admit: 2020-07-06 | Discharge: 2020-07-06 | Disposition: A | Discharge status: Home / Self Care | Payer: 59 | Source: Ambulatory Visit | Attending: Physician Assistant | Admitting: Physician Assistant

## 2020-07-06 ENCOUNTER — Ambulatory Visit (HOSPITAL_COMMUNITY)
Admission: RE | Admit: 2020-07-06 | Discharge: 2020-07-06 | Disposition: A | Discharge status: Home / Self Care | Payer: 59 | Source: Ambulatory Visit | Attending: Family Medicine | Admitting: Family Medicine

## 2020-07-06 ENCOUNTER — Other Ambulatory Visit: Payer: Self-pay

## 2020-07-06 VITALS — BP 118/74 | HR 53 | Ht 62.0 in | Wt 161.6 lb

## 2020-07-06 DIAGNOSIS — G4733 Obstructive sleep apnea (adult) (pediatric): Secondary | ICD-10-CM | POA: Insufficient documentation

## 2020-07-06 DIAGNOSIS — I480 Paroxysmal atrial fibrillation: Secondary | ICD-10-CM | POA: Diagnosis not present

## 2020-07-06 DIAGNOSIS — D869 Sarcoidosis, unspecified: Secondary | ICD-10-CM | POA: Insufficient documentation

## 2020-07-06 DIAGNOSIS — I341 Nonrheumatic mitral (valve) prolapse: Secondary | ICD-10-CM | POA: Diagnosis not present

## 2020-07-06 DIAGNOSIS — Z79899 Other long term (current) drug therapy: Secondary | ICD-10-CM | POA: Insufficient documentation

## 2020-07-06 DIAGNOSIS — R001 Bradycardia, unspecified: Secondary | ICD-10-CM | POA: Diagnosis not present

## 2020-07-06 LAB — ECHOCARDIOGRAM COMPLETE
Area-P 1/2: 4.1 cm2
S' Lateral: 2.6 cm

## 2020-07-06 NOTE — Progress Notes (Signed)
Primary Care Physician: Elias Else, MD Primary Cardiologist: none Primary Electrophysiologist: none Referring Physician: [facility]   Christina Mclaughlin is a 60 y.o. female with a history of sarcoidosis, MVP and atrial fibrillation who presents for follow up in the [facility]. The patient was initially diagnosed with atrial fibrillation 05/22/20 after presenting to the ED with symptoms of tachypalpitations. ECG showed afib with RVR. She underwent successful DCCV at that time. Patient was started on Eliquis for a CHADS2VASC score of 1. She denies any alcohol use but does admit to snoring and witnessed apnea. Several of her immediate family members have been diagnosed with OSA.  On follow up today, patient reports that she has done well since her last visit. She denies any heart racing or palpitations. She has been diagnosed with OSA and is waiting on CPAP titration.   Today, she denies symptoms of palpitations, chest pain, shortness of breath, orthopnea, PND, lower extremity edema, dizziness, presyncope, syncope, bleeding, or neurologic sequela. The patient is tolerating medications without difficulties and is otherwise without complaint today.    Atrial Fibrillation Risk Factors:  she does have symptoms or diagnosis of sleep apnea. she does not have a history of rheumatic fever. she does not have a history of alcohol use. The patient does not have a history of early familial atrial fibrillation or other arrhythmias.  she has a BMI of Body mass index is 29.56 kg/m.Marland Kitchen Filed Weights   07/06/20 0916  Weight: 73.3 kg    Family History  Problem Relation Age of Onset  . Stroke Mother   . Dementia Mother   . Heart failure Father   . Stroke Father   . Heart failure Sister   . Cancer Paternal Aunt        ? breast  . Sarcoidosis Sister      Atrial Fibrillation Management history:  Previous antiarrhythmic drugs: none Previous cardioversions:  05/22/20 Previous ablations: none CHADS2VASC score: 1 Anticoagulation history: Eliquis   Past Medical History:  Diagnosis Date  . MVP (mitral valve prolapse)   . Sarcoidosis    Past Surgical History:  Procedure Laterality Date  . BREAST EXCISIONAL BIOPSY Left over 30 years   x 2 benign  . CESAREAN SECTION    . CHOLECYSTECTOMY    . COLPOSCOPY VULVA W/ BIOPSY      Current Outpatient Medications  Medication Sig Dispense Refill  . albuterol (VENTOLIN HFA) 108 (90 Base) MCG/ACT inhaler Inhale 1-2 puffs into the lungs every 6 (six) hours as needed for wheezing or shortness of breath.    . Calcium-Vitamin D-Vitamin K (VIACTIV CALCIUM PLUS D PO) Take 1 tablet by mouth daily.    . ferrous sulfate 325 (65 FE) MG tablet Take 325 mg by mouth at bedtime.    . Multiple Vitamins-Minerals (WOMENS 50+ MULTI VITAMIN/MIN) TABS See admin instructions.    . pravastatin (PRAVACHOL) 80 MG tablet 1 tablet    . triamcinolone cream (KENALOG) 0.5 % 1 application to affected area     No current facility-administered medications for this encounter.    No Known Allergies  Social History   Socioeconomic History  . Marital status: Single    Spouse name: Not on file  . Number of children: Not on file  . Years of education: Not on file  . Highest education level: Not on file  Occupational History  . Not on file  Tobacco Use  . Smoking status: Never Smoker  . Smokeless tobacco:  Never Used  Substance and Sexual Activity  . Alcohol use: No  . Drug use: No  . Sexual activity: Not on file  Other Topics Concern  . Not on file  Social History Narrative  . Not on file   Social Determinants of Health   Financial Resource Strain: Not on file  Food Insecurity: Not on file  Transportation Needs: Not on file  Physical Activity: Not on file  Stress: Not on file  Social Connections: Not on file  Intimate Partner Violence: Not on file     ROS- All systems are reviewed and negative except as per the  HPI above.  Physical Exam: Vitals:   07/06/20 0916  BP: 118/74  Pulse: (!) 53  Weight: 73.3 kg  Height: 5\' 2"  (1.575 m)    GEN- The patient is a well appearing female, alert and oriented x 3 today.   HEENT-head normocephalic, atraumatic, sclera clear, conjunctiva pink, hearing intact, trachea midline. Lungs- Clear to ausculation bilaterally, normal work of breathing Heart- Regular rate and rhythm, bradycardia, no murmurs, rubs or gallops  GI- soft, NT, ND, + BS Extremities- no clubbing, cyanosis, or edema MS- no significant deformity or atrophy Skin- no rash or lesion Psych- euthymic mood, full affect Neuro- strength and sensation are intact   Wt Readings from Last 3 Encounters:  07/06/20 73.3 kg  05/25/20 71.1 kg  01/09/15 75.8 kg    EKG today demonstrates  SB, 1st degree AV block Vent. rate 53 BPM PR interval 212 ms QRS duration 70 ms QT/QTcB 442/414 ms  Epic records are reviewed at length today  CHA2DS2-VASc Score = 1  The patient's score is based upon: CHF History: No HTN History: No Diabetes History: No Stroke History: No Vascular Disease History: No Age Score: 0 Gender Score: 1      ASSESSMENT AND PLAN: 1. Paroxysmal Atrial Fibrillation (ICD10:  I48.0) The patient's CHA2DS2-VASc score is 1, indicating a 0.6% annual risk of stroke.   Patient appears to be maintaining SR.  Echo results pending. Now off anticoagulation with low CV score.   2. OSA Diagnosed with OSA. Awaiting CPAP titration.  Followed by Dr 14/05/16   3. MVP Patient reports h/o same. Echo results pending.   Follow up in the AF clinic in 6 months.    Earl Gala PA-C Afib Clinic [facility] [address] [place], [place] [phone] 07/06/2020 10:18 AM

## 2020-07-06 NOTE — Progress Notes (Signed)
  Echocardiogram 2D Echocardiogram has been performed.  Christina Mclaughlin 07/06/2020, 9:56 AM

## 2020-07-07 ENCOUNTER — Encounter (HOSPITAL_COMMUNITY): Payer: Self-pay | Admitting: *Deleted

## 2020-09-11 ENCOUNTER — Other Ambulatory Visit: Payer: Self-pay | Admitting: Family Medicine

## 2020-09-11 DIAGNOSIS — Z1231 Encounter for screening mammogram for malignant neoplasm of breast: Secondary | ICD-10-CM

## 2020-10-23 ENCOUNTER — Other Ambulatory Visit: Payer: Self-pay

## 2020-10-23 ENCOUNTER — Ambulatory Visit
Admission: RE | Admit: 2020-10-23 | Discharge: 2020-10-23 | Disposition: A | Discharge status: Home / Self Care | Payer: 59 | Source: Ambulatory Visit | Attending: Family Medicine | Admitting: Family Medicine

## 2020-10-23 DIAGNOSIS — Z1231 Encounter for screening mammogram for malignant neoplasm of breast: Secondary | ICD-10-CM

## 2020-10-27 ENCOUNTER — Other Ambulatory Visit: Payer: Self-pay | Admitting: Family Medicine

## 2020-10-27 DIAGNOSIS — R928 Other abnormal and inconclusive findings on diagnostic imaging of breast: Secondary | ICD-10-CM

## 2020-11-06 ENCOUNTER — Other Ambulatory Visit: Payer: Self-pay

## 2020-11-06 ENCOUNTER — Ambulatory Visit: Payer: 59

## 2020-11-06 ENCOUNTER — Ambulatory Visit
Admission: RE | Admit: 2020-11-06 | Discharge: 2020-11-06 | Disposition: A | Discharge status: Home / Self Care | Payer: 59 | Source: Ambulatory Visit | Attending: Family Medicine | Admitting: Family Medicine

## 2020-11-06 DIAGNOSIS — R928 Other abnormal and inconclusive findings on diagnostic imaging of breast: Secondary | ICD-10-CM

## 2021-01-05 ENCOUNTER — Ambulatory Visit (HOSPITAL_COMMUNITY)
Admission: RE | Admit: 2021-01-05 | Discharge: 2021-01-05 | Disposition: A | Discharge status: Home / Self Care | Payer: 59 | Source: Ambulatory Visit | Attending: Physician Assistant | Admitting: Physician Assistant

## 2021-01-05 ENCOUNTER — Encounter (HOSPITAL_COMMUNITY): Payer: Self-pay | Admitting: Physician Assistant

## 2021-01-05 ENCOUNTER — Other Ambulatory Visit: Payer: Self-pay

## 2021-01-05 VITALS — BP 132/82 | HR 67 | Ht 62.0 in | Wt 165.0 lb

## 2021-01-05 DIAGNOSIS — I480 Paroxysmal atrial fibrillation: Secondary | ICD-10-CM | POA: Diagnosis not present

## 2021-01-05 DIAGNOSIS — D869 Sarcoidosis, unspecified: Secondary | ICD-10-CM | POA: Insufficient documentation

## 2021-01-05 DIAGNOSIS — G4733 Obstructive sleep apnea (adult) (pediatric): Secondary | ICD-10-CM | POA: Diagnosis not present

## 2021-01-05 NOTE — Progress Notes (Signed)
Primary Care Physician: Maury Dus, MD Primary Cardiologist: none Primary Electrophysiologist: none Referring Physician: [facility]   Christina Mclaughlin is a 60 y.o. female with a history of sarcoidosis, MVP and atrial fibrillation who presents for follow up in the [facility]. The patient was initially diagnosed with atrial fibrillation 05/22/20 after presenting to the ED with symptoms of tachypalpitations. ECG showed afib with RVR. She underwent successful DCCV at that time. Patient was started on Eliquis for a CHADS2VASC score of 1. She denies any alcohol use but does admit to snoring and witnessed apnea. Several of her immediate family members have been diagnosed with OSA.  On follow up today, patient reports that recently she has had 1-2 episodes of palpitations as she lays down at night. They can last for several minutes. She has a Fitbit but has not checked her rhythm during her symptoms. She does use her CPAP but has had issues recently with air leaking.   Today, she denies symptoms of chest pain, shortness of breath, orthopnea, PND, lower extremity edema, dizziness, presyncope, syncope, bleeding, or neurologic sequela. The patient is tolerating medications without difficulties and is otherwise without complaint today.    Atrial Fibrillation Risk Factors:  she does have symptoms or diagnosis of sleep apnea. She is compliant with CPAP therapy. she does not have a history of rheumatic fever. she does not have a history of alcohol use. The patient does not have a history of early familial atrial fibrillation or other arrhythmias.  she has a BMI of Body mass index is 30.18 kg/m.Marland Kitchen Filed Weights   01/05/21 1004  Weight: 74.8 kg     Family History  Problem Relation Age of Onset   Stroke Mother    Dementia Mother    Heart failure Father    Stroke Father    Heart failure Sister    Cancer Paternal Aunt        ? breast   Sarcoidosis Sister       Atrial Fibrillation Management history:  Previous antiarrhythmic drugs: none Previous cardioversions: 05/22/20 Previous ablations: none CHADS2VASC score: 1 Anticoagulation history: Eliquis   Past Medical History:  Diagnosis Date   MVP (mitral valve prolapse)    Sarcoidosis    Past Surgical History:  Procedure Laterality Date   BREAST EXCISIONAL BIOPSY Left over 30 years   x 2 benign   CESAREAN SECTION     CHOLECYSTECTOMY     COLPOSCOPY VULVA W/ BIOPSY      Current Outpatient Medications  Medication Sig Dispense Refill   albuterol (VENTOLIN HFA) 108 (90 Base) MCG/ACT inhaler Inhale 1-2 puffs into the lungs every 6 (six) hours as needed for wheezing or shortness of breath.     Calcium-Vitamin D-Vitamin K (VIACTIV CALCIUM PLUS D PO) Take 1 tablet by mouth daily.     ferrous sulfate 325 (65 FE) MG tablet Take 325 mg by mouth at bedtime.     Multiple Vitamins-Minerals (WOMENS 50+ MULTI VITAMIN/MIN) TABS See admin instructions.     pravastatin (PRAVACHOL) 80 MG tablet 1 tablet     triamcinolone cream (KENALOG) 0.5 % 1 application to affected area     No current facility-administered medications for this encounter.    No Known Allergies  Social History   Socioeconomic History   Marital status: Single    Spouse name: Not on file   Number of children: Not on file   Years of education: Not on file   Highest education  level: Not on file  Occupational History   Not on file  Tobacco Use   Smoking status: Never   Smokeless tobacco: Never  Substance and Sexual Activity   Alcohol use: No   Drug use: No   Sexual activity: Not on file  Other Topics Concern   Not on file  Social History Narrative   Not on file   Social Determinants of Health   Financial Resource Strain: Not on file  Food Insecurity: Not on file  Transportation Needs: Not on file  Physical Activity: Not on file  Stress: Not on file  Social Connections: Not on file  Intimate Partner Violence: Not  on file     ROS- All systems are reviewed and negative except as per the HPI above.  Physical Exam: Vitals:   01/05/21 1004  BP: 132/82  Pulse: 67  Weight: 74.8 kg  Height: 5\' 2"  (1.575 m)    GEN- The patient is a well appearing obese female, alert and oriented x 3 today.   HEENT-head normocephalic, atraumatic, sclera clear, conjunctiva pink, hearing intact, trachea midline. Lungs- Clear to ausculation bilaterally, normal work of breathing Heart- Regular rate and rhythm, no murmurs, rubs or gallops  GI- soft, NT, ND, + BS Extremities- no clubbing, cyanosis, or edema MS- no significant deformity or atrophy Skin- no rash or lesion Psych- euthymic mood, full affect Neuro- strength and sensation are intact   Wt Readings from Last 3 Encounters:  01/05/21 74.8 kg  07/06/20 73.3 kg  05/25/20 71.1 kg    EKG today demonstrates  SR Vent. rate 67 BPM PR interval 182 ms QRS duration 70 ms QT/QTcB 406/429 ms  Echo 07/06/20 demonstrated  1. Left ventricular ejection fraction, by estimation, is 60 to 65%. The  left ventricle has normal function. The left ventricle has no regional  wall motion abnormalities. Left ventricular diastolic parameters were  normal. The "bite-out" appearance of cardiac sarcoidosis is not visualized.   2. Right ventricular systolic function is normal. The right ventricular  size is normal. Tricuspid regurgitation signal is inadequate for assessing PA pressure.   3. The mitral valve is normal in structure. Trivial mitral valve  regurgitation. No evidence of mitral stenosis.   4. The aortic valve was not well visualized. Aortic valve regurgitation  is not visualized. No aortic stenosis is present.   5. The inferior vena cava is normal in size with greater than 50%  respiratory variability, suggesting right atrial pressure of 3 mmHg.   Epic records are reviewed at length today  CHA2DS2-VASc Score = 1  The patient's score is based upon: CHF History:  0 HTN History: 0 Diabetes History: 0 Stroke History: 0 Vascular Disease History: 0 Age Score: 0 Gender Score: 1      ASSESSMENT AND PLAN: 1. Paroxysmal Atrial Fibrillation (ICD10:  I48.0) The patient's CHA2DS2-VASc score is 1, indicating a 0.6% annual risk of stroke.   Patient having some symptoms of palpitations in the evening. Will have her monitor and try to capture arrhythmia on smart watch.  Now off anticoagulation with low CV score.  Could consider daily rate control with metoprolol or diltiazem if needed.   2. OSA On CPAP, followed by Dr 09/05/20   3. MVP By history, no MVP on echo.   Follow up in the AF clinic in 2 months.    Earl Gala PA-C Afib Clinic [facility] [address] [place], [place] [phone] 01/05/2021 10:36 AM

## 2021-03-09 ENCOUNTER — Ambulatory Visit (HOSPITAL_COMMUNITY): Payer: 59 | Admitting: Physician Assistant

## 2021-11-14 ENCOUNTER — Other Ambulatory Visit: Payer: Self-pay | Admitting: Family Medicine

## 2021-11-14 ENCOUNTER — Other Ambulatory Visit: Payer: Self-pay | Admitting: Physician Assistant

## 2021-11-14 DIAGNOSIS — Z1231 Encounter for screening mammogram for malignant neoplasm of breast: Secondary | ICD-10-CM

## 2021-12-19 ENCOUNTER — Encounter (HOSPITAL_COMMUNITY): Payer: Self-pay | Admitting: Physician Assistant

## 2021-12-19 ENCOUNTER — Ambulatory Visit (HOSPITAL_COMMUNITY)
Admission: RE | Admit: 2021-12-19 | Discharge: 2021-12-19 | Disposition: A | Discharge status: Home / Self Care | Payer: Commercial Managed Care - PPO | Source: Ambulatory Visit | Attending: Physician Assistant | Admitting: Physician Assistant

## 2021-12-19 ENCOUNTER — Ambulatory Visit
Admission: RE | Admit: 2021-12-19 | Discharge: 2021-12-19 | Disposition: A | Discharge status: Home / Self Care | Payer: Commercial Managed Care - PPO | Source: Ambulatory Visit | Attending: Physician Assistant | Admitting: Physician Assistant

## 2021-12-19 VITALS — BP 116/78 | HR 59 | Ht 62.0 in | Wt 161.6 lb

## 2021-12-19 DIAGNOSIS — D869 Sarcoidosis, unspecified: Secondary | ICD-10-CM | POA: Diagnosis not present

## 2021-12-19 DIAGNOSIS — I341 Nonrheumatic mitral (valve) prolapse: Secondary | ICD-10-CM | POA: Diagnosis not present

## 2021-12-19 DIAGNOSIS — G4733 Obstructive sleep apnea (adult) (pediatric): Secondary | ICD-10-CM | POA: Diagnosis not present

## 2021-12-19 DIAGNOSIS — Z1231 Encounter for screening mammogram for malignant neoplasm of breast: Secondary | ICD-10-CM

## 2021-12-19 DIAGNOSIS — Z7901 Long term (current) use of anticoagulants: Secondary | ICD-10-CM | POA: Insufficient documentation

## 2021-12-19 DIAGNOSIS — I480 Paroxysmal atrial fibrillation: Secondary | ICD-10-CM

## 2021-12-19 NOTE — Progress Notes (Addendum)
Primary Care Physician: Elias Else, MD Primary Cardiologist: none Primary Electrophysiologist: none Referring Physician: [facility]   Christina Mclaughlin is a 61 y.o. female with a history of sarcoidosis, OSA, MVP and atrial fibrillation who presents for follow up in the [facility]. The patient was initially diagnosed with atrial fibrillation 05/22/20 after presenting to the ED with symptoms of tachypalpitations. ECG showed afib with RVR. She underwent successful DCCV at that time. Patient was started on Eliquis for a CHADS2VASC score of 1. She denies any alcohol use but has been diagnosed with OSA.  On follow up today, patient reports that she has done very well since her last visit. She has very rare and brief palpitations when she lays down at night, not bothersome for her. She has not had any sustained rapid heart rates noted on her FitBit.    Today, she denies symptoms of chest pain, shortness of breath, orthopnea, PND, lower extremity edema, dizziness, presyncope, syncope, bleeding, or neurologic sequela. The patient is tolerating medications without difficulties and is otherwise without complaint today.    Atrial Fibrillation Risk Factors:  she does have symptoms or diagnosis of sleep apnea. She is compliant with CPAP therapy. she does not have a history of rheumatic fever. she does not have a history of alcohol use. The patient does not have a history of early familial atrial fibrillation or other arrhythmias.  she has a BMI of Body mass index is 29.56 kg/m.Marland Kitchen Filed Weights   12/19/21 0913  Weight: 73.3 kg    Family History  Problem Relation Age of Onset   Stroke Mother    Dementia Mother    Heart failure Father    Stroke Father    Heart failure Sister    Cancer Paternal Aunt        ? breast   Sarcoidosis Sister      Atrial Fibrillation Management history:  Previous antiarrhythmic drugs: none Previous cardioversions:  05/22/20 Previous ablations: none CHADS2VASC score: 1 Anticoagulation history: Eliquis   Past Medical History:  Diagnosis Date   MVP (mitral valve prolapse)    Sarcoidosis    Past Surgical History:  Procedure Laterality Date   BREAST EXCISIONAL BIOPSY Left over 30 years   x 2 benign   CESAREAN SECTION     CHOLECYSTECTOMY     COLPOSCOPY VULVA W/ BIOPSY      Current Outpatient Medications  Medication Sig Dispense Refill   albuterol (VENTOLIN HFA) 108 (90 Base) MCG/ACT inhaler Inhale 1-2 puffs into the lungs every 6 (six) hours as needed for wheezing or shortness of breath.     Calcium-Vitamin D-Vitamin K (VIACTIV CALCIUM PLUS D PO) Take 1 tablet by mouth daily.     ferrous sulfate 325 (65 FE) MG tablet Take 325 mg by mouth at bedtime.     Multiple Vitamins-Minerals (WOMENS 50+ MULTI VITAMIN/MIN) TABS See admin instructions.     pravastatin (PRAVACHOL) 80 MG tablet Take 40 mg by mouth daily.     triamcinolone cream (KENALOG) 0.5 % 1 application to affected area     No current facility-administered medications for this encounter.    No Known Allergies  Social History   Socioeconomic History   Marital status: Single    Spouse name: Not on file   Number of children: Not on file   Years of education: Not on file   Highest education level: Not on file  Occupational History   Not on file  Tobacco Use  Smoking status: Never   Smokeless tobacco: Never   Tobacco comments:    Never smoke 12/19/21  Substance and Sexual Activity   Alcohol use: No   Drug use: No   Sexual activity: Not on file  Other Topics Concern   Not on file  Social History Narrative   Not on file   Social Determinants of Health   Financial Resource Strain: Not on file  Food Insecurity: Not on file  Transportation Needs: Not on file  Physical Activity: Not on file  Stress: Not on file  Social Connections: Not on file  Intimate Partner Violence: Not on file     ROS- All systems are reviewed and  negative except as per the HPI above.  Physical Exam: Vitals:   12/19/21 0913  BP: 116/78  Pulse: (!) 59  Weight: 73.3 kg  Height: 5\' 2"  (1.575 m)     GEN- The patient is a well appearing female, alert and oriented x 3 today.   HEENT-head normocephalic, atraumatic, sclera clear, conjunctiva pink, hearing intact, trachea midline. Lungs- Clear to ausculation bilaterally, normal work of breathing Heart- Regular rate and rhythm, no murmurs, rubs or gallops  GI- soft, NT, ND, + BS Extremities- no clubbing, cyanosis, or edema MS- no significant deformity or atrophy Skin- no rash or lesion Psych- euthymic mood, full affect Neuro- strength and sensation are intact   Wt Readings from Last 3 Encounters:  12/19/21 73.3 kg  01/05/21 74.8 kg  07/06/20 73.3 kg    EKG today demonstrates  SB Vent. rate 59 BPM PR interval 198 ms QRS duration 70 ms QT/QTcB 398/394 ms  Echo 07/06/20 demonstrated  1. Left ventricular ejection fraction, by estimation, is 60 to 65%. The  left ventricle has normal function. The left ventricle has no regional  wall motion abnormalities. Left ventricular diastolic parameters were  normal. The "bite-out" appearance of cardiac sarcoidosis is not visualized.   2. Right ventricular systolic function is normal. The right ventricular  size is normal. Tricuspid regurgitation signal is inadequate for assessing PA pressure.   3. The mitral valve is normal in structure. Trivial mitral valve  regurgitation. No evidence of mitral stenosis.   4. The aortic valve was not well visualized. Aortic valve regurgitation  is not visualized. No aortic stenosis is present.   5. The inferior vena cava is normal in size with greater than 50%  respiratory variability, suggesting right atrial pressure of 3 mmHg.   Epic records are reviewed at length today  CHA2DS2-VASc Score = 1  The patient's score is based upon: CHF History: 0 HTN History: 0 Diabetes History: 0 Stroke History:  0 Vascular Disease History: 0 Age Score: 0 Gender Score: 1        ASSESSMENT AND PLAN: 1. Paroxysmal Atrial Fibrillation (ICD10:  I48.0) The patient's CHA2DS2-VASc score is 1, indicating a 0.6% annual risk of stroke.   Patient appears to be maintaining SR. Now off anticoagulation with low CV score.  Could consider daily rate control with metoprolol or diltiazem if needed.  She may also be a good candidate for front-line ablation if she has significant recurrence of her afib.  2. OSA Encouraged compliance with CPAP therapy. Followed by [facility] sleep medicine.   3. MVP By history, no MVP on echo.   Follow up in the AF clinic as needed. Will refer her to establish care with a primary cardiologist.    [facility] PA-C Afib Clinic [facility] [address] [place], [place]  [address] [phone] 12/19/2021 9:33 AM

## 2022-06-09 NOTE — Progress Notes (Unsigned)
Cardiology Office Note:   Date:  06/09/2022  ID:  Christina Mclaughlin, DOB [birth year], MRN [id number]  History of Present Illness:   Christina Mclaughlin is a 62 y.o. female with a history of sarcoidosis, paroxysmal Afib, OSA and MVP who was referred by Dr. Nicholos Johns for further evaluation of pAFib  Patient is followed by Afib clinic. Was initially diagnosed with Afib in 05/2020 after presenting to the ER with palpitations. Underwent DCCV at that time and was started on apixaban for [facility]. TTE with EF 60-65%, normal RV, no significant valve disease.  Today, ***  Past Medical History:  Diagnosis Date   Atrial fibrillation (HCC)    MVP (mitral valve prolapse)    Sarcoidosis      ROS: ***  Studies Reviewed:    EKG:  ***  Cardiac Studies & Procedures       ECHOCARDIOGRAM  ECHOCARDIOGRAM COMPLETE 07/06/2020  Narrative ECHOCARDIOGRAM REPORT    Patient Name:   Christina Mclaughlin [facility] Date of Exam: 07/06/2020 Medical Rec #:  [id number]            Height:       62.0 in Accession #:    [id number]           Weight:       156.8 lb Date of Birth:  1961-01-10           BSA:          1.724 m Patient Age:    59 years             BP:           125/72 mmHg Patient Gender: F                    HR:           57 bpm. Exam Location:  Outpatient  Procedure: 2D Echo, Cardiac Doppler and Color Doppler  Indications:    I48.0 Paroxysmal atrial fibrillation  History:        Patient has no prior history of Echocardiogram examinations. Mitral Valve Prolapse. Sarcoidosis.  Sonographer:    Tiffany Dance Referring Phys: [id number] CLINT R FENTON  IMPRESSIONS   1. Left ventricular ejection fraction, by estimation, is 60 to 65%. The left ventricle has normal function. The left ventricle has no regional wall motion abnormalities. Left ventricular diastolic parameters were normal. The "bite-out" appearance of cardiac sarcoidosis is not visualized. 2. Right ventricular systolic function is normal. The right  ventricular size is normal. Tricuspid regurgitation signal is inadequate for assessing PA pressure. 3. The mitral valve is normal in structure. Trivial mitral valve regurgitation. No evidence of mitral stenosis. 4. The aortic valve was not well visualized. Aortic valve regurgitation is not visualized. No aortic stenosis is present. 5. The inferior vena cava is normal in size with greater than 50% respiratory variability, suggesting right atrial pressure of 3 mmHg.  Comparison(s): No prior Echocardiogram.  FINDINGS Left Ventricle: Left ventricular ejection fraction, by estimation, is 60 to 65%. The left ventricle has normal function. The left ventricle has no regional wall motion abnormalities. The left ventricular internal cavity size was normal in size. There is no left ventricular hypertrophy. Left ventricular diastolic parameters were normal.  Right Ventricle: The right ventricular size is normal. No increase in right ventricular wall thickness. Right ventricular systolic function is normal. Tricuspid regurgitation signal is inadequate for assessing PA pressure.  Left Atrium: Left atrial size was normal in size.  Right Atrium: Right  atrial size was normal in size.  Pericardium: There is no evidence of pericardial effusion.  Mitral Valve: The mitral valve is normal in structure. Trivial mitral valve regurgitation. No evidence of mitral valve stenosis.  Tricuspid Valve: The tricuspid valve is normal in structure. Tricuspid valve regurgitation is trivial. No evidence of tricuspid stenosis.  Aortic Valve: The aortic valve was not well visualized. Aortic valve regurgitation is not visualized. No aortic stenosis is present.  Pulmonic Valve: The pulmonic valve was grossly normal. Pulmonic valve regurgitation is trivial. No evidence of pulmonic stenosis.  Aorta: The aortic root and ascending aorta are structurally normal, with no evidence of dilitation.  Venous: The inferior vena cava is  normal in size with greater than 50% respiratory variability, suggesting right atrial pressure of 3 mmHg.  IAS/Shunts: The atrial septum is grossly normal.   LEFT VENTRICLE PLAX 2D LVIDd:         4.20 cm LVIDs:         2.60 cm LV PW:         0.80 cm LV IVS:        0.90 cm LVOT diam:     1.80 cm LV SV:         59 LV SV Index:   34 LVOT Area:     2.54 cm   RIGHT VENTRICLE             IVC RV Basal diam:  2.80 cm     IVC diam: 1.40 cm RV S prime:     14.00 cm/s TAPSE (M-mode): 3.0 cm  LEFT ATRIUM             Index       RIGHT ATRIUM           Index LA diam:        3.10 cm 1.80 cm/m  RA Area:     11.00 cm LA Vol (A2C):   42.9 ml 24.89 ml/m RA Volume:   23.90 ml  13.86 ml/m LA Vol (A4C):   49.0 ml 28.42 ml/m LA Biplane Vol: 47.4 ml 27.50 ml/m AORTIC VALVE LVOT Vmax:   100.85 cm/s LVOT Vmean:  63.950 cm/s LVOT VTI:    0.232 m  AORTA Ao Root diam: 2.70 cm Ao Asc diam:  2.70 cm  MITRAL VALVE MV Area (PHT): 4.10 cm    SHUNTS MV Decel Time: 185 msec    Systemic VTI:  0.23 m MV E velocity: 87.33 cm/s  Systemic Diam: 1.80 cm MV A velocity: 87.50 cm/s MV E/A ratio:  1.00  Riley Lam MD Electronically signed by Riley Lam MD Signature Date/Time: 07/06/2020/4:03:51 PM    Final              Risk Assessment/Calculations:   {Does this patient have ATRIAL FIBRILLATION?[phone]} No BP recorded.  {Refresh Note OR Click here to enter BP  :1}***        Physical Exam:   VS:  There were no vitals taken for this visit.   Wt Readings from Last 3 Encounters:  12/19/21 161 lb 9.6 oz (73.3 kg)  01/05/21 165 lb (74.8 kg)  07/06/20 161 lb 9.6 oz (73.3 kg)     GEN: Well nourished, well developed in no acute distress NECK: No JVD; No carotid bruits CARDIAC: ***RRR, no murmurs, rubs, gallops RESPIRATORY:  Clear to auscultation without rales, wheezing or rhonchi  ABDOMEN: Soft, non-tender, non-distended EXTREMITIES:  No edema; No deformity    ASSESSMENT AND PLAN:   #  Paroxysmal Afib: -Diagnosed in 2022 when she presented to the ER with palpitations s/p DCCV with return to NSR -TTE 07/2020 with LVEF 60-65%, normal atrial size -Currently doing well and maintaining NSR  -Continue apixaban for [facility]  #Pulmonary Sarcoid: -Follows with Dr. Sherene Sires    {Are you ordering a CV Procedure (e.g. stress test, cath, DCCV, TEE, etc)?   Press F2        :[id number]}   Signed, Meriam Sprague, MD

## 2022-06-20 ENCOUNTER — Ambulatory Visit: Payer: Commercial Managed Care - PPO | Attending: Cardiology | Admitting: Cardiology

## 2022-06-20 ENCOUNTER — Encounter: Payer: Self-pay | Admitting: Cardiology

## 2022-06-20 VITALS — BP 132/82 | HR 59 | Ht 62.0 in | Wt 167.2 lb

## 2022-06-20 DIAGNOSIS — I480 Paroxysmal atrial fibrillation: Secondary | ICD-10-CM | POA: Diagnosis not present

## 2022-06-20 DIAGNOSIS — Z8249 Family history of ischemic heart disease and other diseases of the circulatory system: Secondary | ICD-10-CM | POA: Diagnosis not present

## 2022-06-20 DIAGNOSIS — D860 Sarcoidosis of lung: Secondary | ICD-10-CM | POA: Diagnosis not present

## 2022-06-20 DIAGNOSIS — Z83438 Family history of other disorder of lipoprotein metabolism and other lipidemia: Secondary | ICD-10-CM

## 2022-06-20 NOTE — Patient Instructions (Signed)
Medication Instructions:   Your physician recommends that you continue on your current medications as directed. Please refer to the Current Medication list given to you today.  *If you need a refill on your cardiac medications before your next appointment, please call your pharmacy*    Testing/Procedures:  CARDIAC CALCIUM SCORE--(SELF PAY)   Follow-Up: At [facility], you and your health needs are our priority.  As part of our continuing mission to provide you with exceptional heart care, we have created designated Provider Care Teams.  These Care Teams include your primary Cardiologist (physician) and Advanced Practice Providers (APPs -  Physician Assistants and Nurse Practitioners) who all work together to provide you with the care you need, when you need it.  We recommend signing up for the patient portal called "MyChart".  Sign up information is provided on this After Visit Summary.  MyChart is used to connect with patients for Virtual Visits (Telemedicine).  Patients are able to view lab/test results, encounter notes, upcoming appointments, etc.  Non-urgent messages can be sent to your provider as well.   To learn more about what you can do with MyChart, go to [email or web address].    Your next appointment:   1 year(s)  Provider:   DR. PEMBERTON   

## 2022-07-24 ENCOUNTER — Ambulatory Visit (HOSPITAL_BASED_OUTPATIENT_CLINIC_OR_DEPARTMENT_OTHER)
Admission: RE | Admit: 2022-07-24 | Discharge: 2022-07-24 | Disposition: A | Discharge status: Home / Self Care | Payer: Commercial Managed Care - PPO | Source: Ambulatory Visit | Attending: Cardiology | Admitting: Cardiology

## 2022-07-24 DIAGNOSIS — Z83438 Family history of other disorder of lipoprotein metabolism and other lipidemia: Secondary | ICD-10-CM | POA: Insufficient documentation

## 2022-07-24 DIAGNOSIS — Z8249 Family history of ischemic heart disease and other diseases of the circulatory system: Secondary | ICD-10-CM | POA: Insufficient documentation

## 2022-07-26 ENCOUNTER — Telehealth: Payer: Self-pay | Admitting: Cardiology

## 2022-07-26 DIAGNOSIS — Z83438 Family history of other disorder of lipoprotein metabolism and other lipidemia: Secondary | ICD-10-CM

## 2022-07-26 DIAGNOSIS — Z8249 Family history of ischemic heart disease and other diseases of the circulatory system: Secondary | ICD-10-CM

## 2022-07-26 DIAGNOSIS — Z79899 Other long term (current) drug therapy: Secondary | ICD-10-CM

## 2022-07-26 DIAGNOSIS — I2510 Atherosclerotic heart disease of native coronary artery without angina pectoris: Secondary | ICD-10-CM

## 2022-07-26 MED ORDER — ROSUVASTATIN CALCIUM 10 MG PO TABS: 10.0000 mg | ORAL_TABLET | Freq: Every day | ORAL | 2 refills | Status: DC

## 2022-07-26 NOTE — Telephone Encounter (Signed)
The patient has been notified of the result and verbalized understanding.  All questions (if any) were answered.  Pt aware to stop taking pravastatin and start taking crestor 10 mg po daily and come in for repeat lipids in 8 weeks to reassess.   Confirmed the pharmacy of choice with the pt.   Scheduled the pt for repeat lipids in 8 weeks on 09/23/22.  She is aware to come fasting to this lab appt.   Pt verbalized understanding and agrees with this plan.

## 2022-07-26 NOTE — Telephone Encounter (Signed)
Patient returned RN's call regarding results. 

## 2022-07-26 NOTE — Telephone Encounter (Signed)
-----   Message from Meriam Sprague, MD sent at 07/24/2022  2:35 PM EDT ----- Her Ca score is 7.17 (76%). Given that she has an elevated Ca score, would recommend changing from pravastatin to crestor 10mg  daily with repeat lipids in 8 weeks if okay with her. Goal LDL<70. If she has muscle aches to the statin, let us know and we can change it.

## 2022-09-23 ENCOUNTER — Ambulatory Visit: Payer: Commercial Managed Care - PPO | Attending: Cardiology

## 2022-09-23 DIAGNOSIS — Z79899 Other long term (current) drug therapy: Secondary | ICD-10-CM

## 2022-09-23 DIAGNOSIS — Z8249 Family history of ischemic heart disease and other diseases of the circulatory system: Secondary | ICD-10-CM

## 2022-09-23 DIAGNOSIS — I2510 Atherosclerotic heart disease of native coronary artery without angina pectoris: Secondary | ICD-10-CM

## 2022-09-23 DIAGNOSIS — Z83438 Family history of other disorder of lipoprotein metabolism and other lipidemia: Secondary | ICD-10-CM

## 2022-09-24 ENCOUNTER — Telehealth: Payer: Self-pay | Admitting: *Deleted

## 2022-09-24 LAB — LIPID PANEL
Chol/HDL Ratio: 2.5 ratio (ref 0.0–4.4)
Cholesterol, Total: 141 mg/dL (ref 100–199)
HDL: 57 mg/dL (ref >39)
LDL Chol Calc (NIH): 71 mg/dL (ref 0–99)
Triglycerides: 62 mg/dL (ref 0–149)
VLDL Cholesterol Cal: 13 mg/dL (ref 5–40)

## 2022-09-24 NOTE — Telephone Encounter (Signed)
-----   Message from [place] sent at 09/24/2022  9:02 AM EDT ----- Regarding: RE: needs new gen cards assigned Recall has been changed to Dr. Izora Ribas ----- Message ----- From: Loa Socks, LPN Sent: 4/54/0981   8:20 AM EDT To: Jabier Gauss; Garald Braver; # Subject: needs new gen cards assigned                   This is a former Dr. Shari Prows pt who triage has some results defaulted to the pool and we need to get this to the pts new Gen Cards to send to and result them.  Pt has not been reassigned yet.  Can you please call her and get her scheduled/recalled for her new General Cardiologist and let me know thereafter, so that I can send her results to that Provider to review?  Thanks for all your help, Lajoyce Corners

## 2022-12-05 ENCOUNTER — Other Ambulatory Visit: Payer: Self-pay | Admitting: Family Medicine

## 2022-12-05 DIAGNOSIS — Z1231 Encounter for screening mammogram for malignant neoplasm of breast: Secondary | ICD-10-CM

## 2022-12-26 ENCOUNTER — Ambulatory Visit
Admission: RE | Admit: 2022-12-26 | Discharge: 2022-12-26 | Disposition: A | Discharge status: Home / Self Care | Payer: Commercial Managed Care - PPO | Source: Ambulatory Visit | Attending: Family Medicine | Admitting: Family Medicine

## 2022-12-26 DIAGNOSIS — Z1231 Encounter for screening mammogram for malignant neoplasm of breast: Secondary | ICD-10-CM

## 2023-05-05 ENCOUNTER — Other Ambulatory Visit: Payer: Self-pay

## 2023-05-05 DIAGNOSIS — Z83438 Family history of other disorder of lipoprotein metabolism and other lipidemia: Secondary | ICD-10-CM

## 2023-05-05 DIAGNOSIS — Z8249 Family history of ischemic heart disease and other diseases of the circulatory system: Secondary | ICD-10-CM

## 2023-05-05 DIAGNOSIS — Z79899 Other long term (current) drug therapy: Secondary | ICD-10-CM

## 2023-05-05 DIAGNOSIS — I2510 Atherosclerotic heart disease of native coronary artery without angina pectoris: Secondary | ICD-10-CM

## 2023-05-05 MED ORDER — ROSUVASTATIN CALCIUM 10 MG PO TABS: 10.0000 mg | ORAL_TABLET | Freq: Every day | ORAL | 0 refills | Status: DC

## 2023-08-08 ENCOUNTER — Other Ambulatory Visit: Payer: Self-pay | Admitting: Physician Assistant

## 2023-08-08 DIAGNOSIS — Z8249 Family history of ischemic heart disease and other diseases of the circulatory system: Secondary | ICD-10-CM

## 2023-08-08 DIAGNOSIS — Z79899 Other long term (current) drug therapy: Secondary | ICD-10-CM

## 2023-08-08 DIAGNOSIS — I2510 Atherosclerotic heart disease of native coronary artery without angina pectoris: Secondary | ICD-10-CM

## 2023-08-08 DIAGNOSIS — Z83438 Family history of other disorder of lipoprotein metabolism and other lipidemia: Secondary | ICD-10-CM

## 2023-09-01 ENCOUNTER — Encounter: Payer: Self-pay | Admitting: Internal Medicine

## 2023-09-01 ENCOUNTER — Ambulatory Visit: Attending: Internal Medicine | Admitting: Internal Medicine

## 2023-09-01 ENCOUNTER — Ambulatory Visit

## 2023-09-01 VITALS — BP 143/77 | HR 57 | Ht 63.0 in | Wt 175.0 lb

## 2023-09-01 DIAGNOSIS — I480 Paroxysmal atrial fibrillation: Secondary | ICD-10-CM | POA: Diagnosis not present

## 2023-09-01 DIAGNOSIS — Z83438 Family history of other disorder of lipoprotein metabolism and other lipidemia: Secondary | ICD-10-CM | POA: Insufficient documentation

## 2023-09-01 DIAGNOSIS — Z8249 Family history of ischemic heart disease and other diseases of the circulatory system: Secondary | ICD-10-CM | POA: Insufficient documentation

## 2023-09-01 DIAGNOSIS — I2510 Atherosclerotic heart disease of native coronary artery without angina pectoris: Secondary | ICD-10-CM | POA: Insufficient documentation

## 2023-09-01 NOTE — Progress Notes (Unsigned)
 Enrolled for [employer] to mail a ZIO XT long term holter monitor to the patients address on file.

## 2023-09-01 NOTE — Patient Instructions (Signed)
 Medication Instructions:  Your physician recommends that you continue on your current medications as directed. Please refer to the Current Medication list given to you today.  *If you need a refill on your cardiac medications before your next appointment, please call your pharmacy*  Lab Work: TODAY at [facility] 1st floor: [facility] [employer] and [facility]  If you have labs (blood work) drawn today and your tests are completely normal, you will receive your results only by: MyChart Message (if you have MyChart) OR A paper copy in the mail If you have any lab test that is abnormal or we need to change your treatment, we will call you to review the results.  Testing/Procedures: Your physician has requested that you wear a heart monitor.    Follow-Up: At [facility], you and your health needs are our priority.  As part of our continuing mission to provide you with exceptional heart care, our providers are all part of one team.  This team includes your primary Cardiologist (physician) and Advanced Practice Providers or APPs (Physician Assistants and Nurse Practitioners) who all work together to provide you with the care you need, when you need it.  Your next appointment:   1 year(s)  Provider:   Stanly Leavens, MD Orren Fabry, PA-C, Jackee Alberts, NP, Lum Louis, NP, or Katlyn West, NP         Other Instructions Christina Mclaughlin- Long Term Monitor Instructions  Your physician has requested you wear a ZIO patch monitor for 7 days.  This is a single patch monitor. [employer] supplies one patch monitor per enrollment. Additional stickers are not available. Please do not apply patch if you will be having a Nuclear Stress Test,   Cardiac CT, MRI, or Chest Xray during the period you would be wearing the  monitor. The patch cannot be worn during these tests. You cannot remove and re-apply the  ZIO XT patch monitor.  Your ZIO patch monitor will be mailed 3 day USPS to your address on file. It may take  3-5 days  to receive your monitor after you have been enrolled.  Once you have received your monitor, please review the enclosed instructions. Your monitor  has already been registered assigning a specific monitor serial # to you.  Billing and Patient Assistance Program Information  We have supplied [employer] with any of your insurance information on file for billing purposes. [employer] offers a sliding scale Patient Assistance Program for patients that do not have  insurance, or whose insurance does not completely cover the cost of the ZIO monitor.  You must apply for the Patient Assistance Program to qualify for this discounted rate.  To apply, please call [employer] at [phone], select option 4, select option 2, ask to apply for  Patient Assistance Program. Meredeth will ask your household income, and how many people  are in your household. They will quote your out-of-pocket cost based on that information.  [employer] will also be able to set up a 33-month, interest-free payment plan if needed.  Applying the monitor   Shave hair from upper left chest.  Hold abrader disc by orange tab. Rub abrader in 40 strokes over the upper left chest as  indicated in your monitor instructions.  Clean area with 4 enclosed alcohol pads. Let dry.  Apply patch as indicated in monitor instructions. Patch will be placed under collarbone on left  side of chest with arrow pointing upward.  Rub patch adhesive wings for 2 minutes. Remove white label marked  1. Remove the white  label marked 2. Rub patch adhesive wings for 2 additional minutes.  While looking in a mirror, press and release button in center of patch. A small green light will  flash 3-4 times. This will be your only indicator that the monitor has been turned on.  Do not shower for the first 24 hours. You may shower after the first 24 hours.  Press the button if you feel a symptom. You will hear a small click. Record Date, Time and  Symptom in the  Patient Logbook.  When you are ready to remove the patch, follow instructions on the last 2 pages of Patient  Logbook. Stick patch monitor onto the last page of Patient Logbook.  Place Patient Logbook in the blue and white box. Use locking tab on box and tape box closed  securely. The blue and white box has prepaid postage on it. Please place it in the mailbox as  soon as possible. Your physician should have your test results approximately 7 days after the  monitor has been mailed back to [facility].  Call [facility] Customer Care at [phone] if you have questions regarding  your ZIO XT patch monitor. Call them immediately if you see an orange light blinking on your  monitor.  If your monitor falls off in less than 4 days, contact our Monitor department at [phone].  If your monitor becomes loose or falls off after 4 days call [employer] at [phone] for  suggestions on securing your monitor

## 2023-09-01 NOTE — Progress Notes (Signed)
 Cardiology Office Note:  .    Date:  09/01/2023  ID:  Christina Mclaughlin, DOB 29-Apr-1960, MRN [id number] PCP: Gib Charleston, MD  [facility] Providers Cardiologist:  None     CC: Transition to new cardiologist   History of Present Illness: .    Christina Mclaughlin is a 63 y.o. 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  Former [facility] patient.  Christina Mclaughlin is a 63 year old female with paroxysmal atrial fibrillation and pulmonary sarcoidosis who presents for her annual cardiovascular follow-up.  She has a history of paroxysmal atrial fibrillation, first diagnosed in 2022, with one isolated episode that required cardioversion. Since then, she has not experienced any further episodes and is not on anticoagulation therapy due to the isolated nature of the event. She monitors her heart rate using a Fitbit and has not noted any significant episodes of rapid heart rate since the initial incident. No recent episodes of syncope, chest pain, or chest pressure.  Her pulmonary sarcoidosis was managed at [facility] in 1999, with evidence of extra cardiac sarcoid. She has not reported any recent symptoms related to this condition.  Family history is significant for coronary artery disease. Her father had high cholesterol, experienced a heart attack and stroke, and died at the age of 4. Her older sister underwent bypass surgery in her 2s. The family has a history of high cholesterol, and many members are under cardiology care.  She has been told her coronary artery calcium  score was low, but from a percentile perspective, it was higher than expected. Her LDL was last recorded at 87 in November, with a goal to reduce it further. She is currently on rosuvastatin  10 mg daily.  She notes occasional restlessness upon waking, which she attributes to her active lifestyle. Her blood pressure is usually normal outside of the office  setting.  Discussed the use of AI scribe software for clinical note transcription with the patient, who gave verbal consent to proceed.   Relevant histories: .  Social  - former [facility] patient  - Father: coronary artery disease, high cholesterol, heart attack, deceased at age 55 due to stroke - Older sister: bypass surgery - No family history of pulmonary sarcoidosis or cardiac sarcoidosis.  ROS: As per HPI.   Studies Reviewed: .     Cardiac Studies & Procedures   ______________________________________________________________________________________________     ECHOCARDIOGRAM  ECHOCARDIOGRAM COMPLETE 07/06/2020  Narrative ECHOCARDIOGRAM REPORT    Patient Name:   Christina Mclaughlin [facility] Date of Exam: 07/06/2020 Medical Rec #:  [id number]            Height:       62.0 in Accession #:    [id number]           Weight:       156.8 lb Date of Birth:  1960/11/18           BSA:          1.724 m Patient Age:    59 years             BP:           125/72 mmHg Patient Gender: F                    HR:           57 bpm. Exam Location:  Outpatient  Procedure: 2D Echo, Cardiac Doppler  and Color Doppler  Indications:    I48.0 Paroxysmal atrial fibrillation  History:        Patient has no prior history of Echocardiogram examinations. Mitral Valve Prolapse. Sarcoidosis.  Sonographer:    Tiffany Dance Referring Phys: [id number] CLINT R FENTON  IMPRESSIONS   1. Left ventricular ejection fraction, by estimation, is 60 to 65%. The left ventricle has normal function. The left ventricle has no regional wall motion abnormalities. Left ventricular diastolic parameters were normal. The bite-out appearance of cardiac sarcoidosis is not visualized. 2. Right ventricular systolic function is normal. The right ventricular size is normal. Tricuspid regurgitation signal is inadequate for assessing PA pressure. 3. The mitral valve is normal in structure. Trivial mitral valve regurgitation. No evidence of  mitral stenosis. 4. The aortic valve was not well visualized. Aortic valve regurgitation is not visualized. No aortic stenosis is present. 5. The inferior vena cava is normal in size with greater than 50% respiratory variability, suggesting right atrial pressure of 3 mmHg.  Comparison(s): No prior Echocardiogram.  FINDINGS Left Ventricle: Left ventricular ejection fraction, by estimation, is 60 to 65%. The left ventricle has normal function. The left ventricle has no regional wall motion abnormalities. The left ventricular internal cavity size was normal in size. There is no left ventricular hypertrophy. Left ventricular diastolic parameters were normal.  Right Ventricle: The right ventricular size is normal. No increase in right ventricular wall thickness. Right ventricular systolic function is normal. Tricuspid regurgitation signal is inadequate for assessing PA pressure.  Left Atrium: Left atrial size was normal in size.  Right Atrium: Right atrial size was normal in size.  Pericardium: There is no evidence of pericardial effusion.  Mitral Valve: The mitral valve is normal in structure. Trivial mitral valve regurgitation. No evidence of mitral valve stenosis.  Tricuspid Valve: The tricuspid valve is normal in structure. Tricuspid valve regurgitation is trivial. No evidence of tricuspid stenosis.  Aortic Valve: The aortic valve was not well visualized. Aortic valve regurgitation is not visualized. No aortic stenosis is present.  Pulmonic Valve: The pulmonic valve was grossly normal. Pulmonic valve regurgitation is trivial. No evidence of pulmonic stenosis.  Aorta: The aortic root and ascending aorta are structurally normal, with no evidence of dilitation.  Venous: The inferior vena cava is normal in size with greater than 50% respiratory variability, suggesting right atrial pressure of 3 mmHg.  IAS/Shunts: The atrial septum is grossly normal.   LEFT VENTRICLE PLAX 2D LVIDd:          4.20 cm LVIDs:         2.60 cm LV PW:         0.80 cm LV IVS:        0.90 cm LVOT diam:     1.80 cm LV SV:         59 LV SV Index:   34 LVOT Area:     2.54 cm   RIGHT VENTRICLE             IVC RV Basal diam:  2.80 cm     IVC diam: 1.40 cm RV S prime:     14.00 cm/s TAPSE (M-mode): 3.0 cm  LEFT ATRIUM             Index       RIGHT ATRIUM           Index LA diam:        3.10 cm 1.80 cm/m  RA Area:     11.00  cm LA Vol (A2C):   42.9 ml 24.89 ml/m RA Volume:   23.90 ml  13.86 ml/m LA Vol (A4C):   49.0 ml 28.42 ml/m LA Biplane Vol: 47.4 ml 27.50 ml/m AORTIC VALVE LVOT Vmax:   100.85 cm/s LVOT Vmean:  63.950 cm/s LVOT VTI:    0.232 m  AORTA Ao Root diam: 2.70 cm Ao Asc diam:  2.70 cm  MITRAL VALVE MV Area (PHT): 4.10 cm    SHUNTS MV Decel Time: 185 msec    Systemic VTI:  0.23 m MV E velocity: 87.33 cm/s  Systemic Diam: 1.80 cm MV A velocity: 87.50 cm/s MV E/A ratio:  1.00  Stanly Leavens MD Electronically signed by Stanly Leavens MD Signature Date/Time: 07/06/2020/4:03:51 PM    Final      CT SCANS  CT CARDIAC SCORING (SELF PAY ONLY) 07/24/2022  Addendum 07/24/2022  4:37 PM ADDENDUM REPORT: 07/24/2022 16:35  EXAM: OVER-READ INTERPRETATION  CT CHEST  The following report is an over-read performed by radiologist Dr. Andrea Gasman of [facility] on 07/24/2022. This over-read does not include interpretation of cardiac or coronary anatomy or pathology. The coronary calcium  score interpretation by the cardiologist is attached.  COMPARISON:  Chest CT 12/04/2014  FINDINGS: Vascular: No aortic atherosclerosis. The included aorta is normal in caliber.  Mediastinum/nodes: No adenopathy or mass. Unremarkable esophagus.  Lungs: No focal airspace disease. Resection of lingular nodule with chain sutures. No pleural fluid. The included airways are patent.  Upper abdomen: No acute or unexpected findings.  Musculoskeletal: There are no  acute or suspicious osseous abnormalities.  IMPRESSION: Resection of previous lingular nodule. No acute or unexpected extracardiac findings.   Electronically Signed By: Andrea Gasman M.D. On: 07/24/2022 16:35  Narrative CLINICAL DATA:  Cardiovascular Disease Risk stratification  EXAM: Coronary Calcium  Score  TECHNIQUE: A gated, non-contrast computed tomography scan of the heart was performed using 3 mm slice thickness. Axial images were analyzed on a dedicated workstation. Calcium  scoring of the coronary arteries was performed using the Agatston method.  FINDINGS: Coronary arteries: Normal origins.  Coronary Calcium  Score:  Left main: 0  Left anterior descending artery: 7.17  Left circumflex artery: 0  Right coronary artery: 0  Total: 7.17  Percentile: 76th  Pericardium: Normal.  Aorta: Normal caliber.  Non-cardiac: See separate report from [facility].  IMPRESSION: Coronary calcium  score of 7.17. This was 76th percentile for age-, race-, and sex-matched controls.  RECOMMENDATIONS: Coronary artery calcium  (CAC) score is a strong predictor of incident coronary heart disease (CHD) and provides predictive information beyond traditional risk factors. CAC scoring is reasonable to use in the decision to withhold, postpone, or initiate statin therapy in intermediate-risk or selected borderline-risk asymptomatic adults (age 74-75 years and LDL-C >=70 to <190 mg/dL) who do not have diabetes or established atherosclerotic cardiovascular disease (ASCVD).* In intermediate-risk (10-year ASCVD risk >=7.5% to <20%) adults or selected borderline-risk (10-year ASCVD risk >=5% to <7.5%) adults in whom a CAC score is measured for the purpose of making a treatment decision the following recommendations have been made:  If CAC=0, it is reasonable to withhold statin therapy and reassess in 5 to 10 years, as long as higher risk conditions are absent (diabetes  mellitus, family history of premature CHD in first degree relatives (males <55 years; females <65 years), cigarette smoking, or LDL >=190 mg/dL).  If CAC is 1 to 99, it is reasonable to initiate statin therapy for patients >=64 years of age.  If CAC is >=100 or >=  75th percentile, it is reasonable to initiate statin therapy at any age.  Cardiology referral should be considered for patients with CAC scores >=400 or >=75th percentile.  *2018 AHA/ACC/AACVPR/AAPA/ABC/ACPM/ADA/AGS/APhA/ASPC/NLA/PCNA Guideline on the Management of Blood Cholesterol: A Report of the American College of Cardiology/American Heart Association Task Force on Clinical Practice Guidelines. J Am Coll Cardiol. 2019;73(24):3168-3209.  Vinie Maxcy, MD  Electronically Signed: By: Vinie JAYSON Maxcy M.D. On: 07/24/2022 12:20     ______________________________________________________________________________________________      Physical Exam:    VS:  BP (!) 143/77 (BP Location: Right Arm)   Pulse (!) 57   Ht 5' 3 (1.6 m)   Wt 175 lb (79.4 kg)   SpO2 95%   BMI 31.00 kg/m    Wt Readings from Last 3 Encounters:  09/01/23 175 lb (79.4 kg)  06/20/22 167 lb 3.2 oz (75.8 kg)  12/19/21 161 lb 9.6 oz (73.3 kg)    Gen: no distress Neck: No JVD  Cardiac: No Rubs or Gallops, no murmur, regular bradycardia +2 radial pulses Respiratory: Clear to auscultation bilaterally, normal effort, normal  respiratory rate GI: Soft, nontender, non-distended  MS: No  edema;  moves all extremities Integument: Skin feels warm Neuro:  At time of evaluation, alert and oriented to person/place/time/situation  Psych: Normal affect, patient feels ok   ASSESSMENT AND PLAN: .    An EKG was ordered for PAF and shows SBRAD FAV  Paroxysmal atrial fibrillation HX of [place] AVMs One isolated episode first diagnosed in 2022 with prior cardioversion. No current episodes or symptoms. Increased risk of recurrence due to age, female sex,  and vascular disease (elevated calcium  score). - Order a one-week heart monitor to assess for arrhythmias. - Consider initiation of anticoagulation therapy if atrial fibrillation recurs to prevent stroke.  Coronary artery calcification Coronary artery calcification with strong family history of coronary artery disease. Previous LDL goal was under 70, recent levels at 87. Aim to lower LDL to under 55 due to family history. - Order direct LDL and lipoprotein(a) tests. - Continue rosuvastatin  10 mg daily. - Reassess lipid levels and adjust treatment as necessary.  Elevated blood pressure Elevated blood pressure during visit, no history of chronic hypertension. Possible white coat hypertension due to situational factors. - Monitor blood pressure at home and report findings. - Reassess blood pressure management based on home readings.  Cardiac Sarcoidosis Eval - With extracardiac sarcoidosis - with biopsy - without palpitations , presyncope, or syncope. - with/without  complete left or right bundle branch block; presence of unexplained pathologic Q waves in two or more leads; sustained first-, second-, or third-degree AV block; or sustained or nonsustained VT  - without  regional wall motion abnormality, ventricular aneurysm, basal septal thinning, or depressed LVEF.  - HR Society recommends Echo,monitor  and annual ECG for those with non-cardiac sarcoidosis   One year with me or my team  Stanly Leavens, MD FASE [facility] Cardiologist [facility]  [address] [place], [address] [place], [place] [address] [phone]  2:01 PM

## 2023-09-02 ENCOUNTER — Ambulatory Visit: Payer: Self-pay | Admitting: Internal Medicine

## 2023-09-02 DIAGNOSIS — Z83438 Family history of other disorder of lipoprotein metabolism and other lipidemia: Secondary | ICD-10-CM

## 2023-09-02 DIAGNOSIS — Z8249 Family history of ischemic heart disease and other diseases of the circulatory system: Secondary | ICD-10-CM

## 2023-09-02 DIAGNOSIS — I2510 Atherosclerotic heart disease of native coronary artery without angina pectoris: Secondary | ICD-10-CM

## 2023-09-02 DIAGNOSIS — Z79899 Other long term (current) drug therapy: Secondary | ICD-10-CM

## 2023-09-02 LAB — LDL CHOLESTEROL, DIRECT: LDL Direct: 66 mg/dL (ref 0–99)

## 2023-09-02 LAB — LIPOPROTEIN A (LPA): Lipoprotein (a): 62 nmol/L (ref <75.0)

## 2023-09-03 MED ORDER — ROSUVASTATIN CALCIUM 10 MG PO TABS: 10.0000 mg | ORAL_TABLET | Freq: Every day | ORAL | 3 refills | Status: AC

## 2023-09-28 DIAGNOSIS — I480 Paroxysmal atrial fibrillation: Secondary | ICD-10-CM

## 2023-12-02 ENCOUNTER — Other Ambulatory Visit: Payer: Self-pay | Admitting: Family Medicine

## 2023-12-02 DIAGNOSIS — Z1231 Encounter for screening mammogram for malignant neoplasm of breast: Secondary | ICD-10-CM

## 2023-12-29 ENCOUNTER — Ambulatory Visit
Admission: RE | Admit: 2023-12-29 | Discharge: 2023-12-29 | Disposition: A | Discharge status: Home / Self Care | Source: Ambulatory Visit | Attending: Family Medicine | Admitting: Family Medicine

## 2023-12-29 DIAGNOSIS — Z1231 Encounter for screening mammogram for malignant neoplasm of breast: Secondary | ICD-10-CM
